# Patient Record
Sex: Male | Born: 1965
Health system: Southern US, Community
[De-identification: ages and names within clinical notes are randomized; demographics above are authoritative.]

## PROBLEM LIST (undated history)

## (undated) DIAGNOSIS — N419 Inflammatory disease of prostate, unspecified: Principal | ICD-10-CM

## (undated) DIAGNOSIS — Z973 Presence of spectacles and contact lenses: Secondary | ICD-10-CM

## (undated) DIAGNOSIS — N320 Bladder-neck obstruction: Secondary | ICD-10-CM

## (undated) DIAGNOSIS — N401 Enlarged prostate with lower urinary tract symptoms: Secondary | ICD-10-CM

## (undated) DIAGNOSIS — H409 Unspecified glaucoma: Secondary | ICD-10-CM

## (undated) DIAGNOSIS — J309 Allergic rhinitis, unspecified: Secondary | ICD-10-CM

## (undated) DIAGNOSIS — I8393 Asymptomatic varicose veins of bilateral lower extremities: Secondary | ICD-10-CM

## (undated) DIAGNOSIS — H269 Unspecified cataract: Secondary | ICD-10-CM

## (undated) HISTORY — PX: EYE SURGERY: SHX253

## (undated) HISTORY — DX: Unspecified glaucoma: H40.9

## (undated) HISTORY — PX: CATARACT EXTRACTION W/ INTRAOCULAR LENS IMPLANT: SHX1309

## (undated) HISTORY — DX: Unspecified cataract: H26.9

## (undated) HISTORY — PX: OTHER SURGICAL HISTORY: SHX169

## (undated) HISTORY — DX: Inflammatory disease of prostate, unspecified: N41.9

---

## 1987-10-06 HISTORY — PX: APPENDECTOMY: SHX54

## 1988-10-05 HISTORY — PX: APPENDECTOMY: SHX54

## 2011-09-05 DIAGNOSIS — Z87438 Personal history of other diseases of male genital organs: Secondary | ICD-10-CM

## 2011-09-05 DIAGNOSIS — N419 Inflammatory disease of prostate, unspecified: Secondary | ICD-10-CM

## 2011-09-05 HISTORY — DX: Personal history of other diseases of male genital organs: Z87.438

## 2011-09-05 HISTORY — DX: Inflammatory disease of prostate, unspecified: N41.9

## 2011-09-11 ENCOUNTER — Ambulatory Visit: Payer: 59

## 2011-09-11 DIAGNOSIS — R300 Dysuria: Secondary | ICD-10-CM

## 2011-11-27 ENCOUNTER — Encounter: Payer: Self-pay | Admitting: Internal Medicine

## 2011-11-27 ENCOUNTER — Ambulatory Visit (INDEPENDENT_AMBULATORY_CARE_PROVIDER_SITE_OTHER): Payer: 59 | Admitting: Internal Medicine

## 2011-11-27 ENCOUNTER — Other Ambulatory Visit (INDEPENDENT_AMBULATORY_CARE_PROVIDER_SITE_OTHER): Payer: 59

## 2011-11-27 VITALS — BP 110/68 | HR 64 | Temp 98.1°F | Resp 16 | Wt 254.5 lb

## 2011-11-27 DIAGNOSIS — N419 Inflammatory disease of prostate, unspecified: Secondary | ICD-10-CM

## 2011-11-27 DIAGNOSIS — Z0000 Encounter for general adult medical examination without abnormal findings: Secondary | ICD-10-CM

## 2011-11-27 DIAGNOSIS — Z136 Encounter for screening for cardiovascular disorders: Secondary | ICD-10-CM

## 2011-11-27 DIAGNOSIS — N410 Acute prostatitis: Secondary | ICD-10-CM | POA: Insufficient documentation

## 2011-11-27 LAB — LIPID PANEL
Cholesterol: 238 mg/dL — ABNORMAL HIGH (ref 0–200)
VLDL: 17.4 mg/dL (ref 0.0–40.0)

## 2011-11-27 LAB — CBC WITH DIFFERENTIAL/PLATELET
Basophils Absolute: 0 10*3/uL (ref 0.0–0.1)
Eosinophils Relative: 2.4 % (ref 0.0–5.0)
HCT: 46.2 % (ref 39.0–52.0)
Lymphocytes Relative: 36.1 % (ref 12.0–46.0)
Lymphs Abs: 1.8 10*3/uL (ref 0.7–4.0)
Monocytes Relative: 7.7 % (ref 3.0–12.0)
Platelets: 212 10*3/uL (ref 150.0–400.0)
RDW: 13.4 % (ref 11.5–14.6)
WBC: 5 10*3/uL (ref 4.5–10.5)

## 2011-11-27 LAB — LDL CHOLESTEROL, DIRECT: Direct LDL: 177.1 mg/dL

## 2011-11-27 LAB — COMPREHENSIVE METABOLIC PANEL
ALT: 45 U/L (ref 0–53)
Albumin: 4.4 g/dL (ref 3.5–5.2)
CO2: 28 mEq/L (ref 19–32)
Calcium: 9.5 mg/dL (ref 8.4–10.5)
Chloride: 102 mEq/L (ref 96–112)
GFR: 79.67 mL/min (ref >60.00)
Glucose, Bld: 87 mg/dL (ref 70–99)
Potassium: 4.6 mEq/L (ref 3.5–5.1)
Sodium: 138 mEq/L (ref 135–145)
Total Protein: 6.9 g/dL (ref 6.0–8.3)

## 2011-11-27 LAB — URINALYSIS, ROUTINE W REFLEX MICROSCOPIC
Bilirubin Urine: NEGATIVE
Ketones, ur: NEGATIVE
Leukocytes, UA: NEGATIVE
Nitrite: NEGATIVE
Specific Gravity, Urine: 1.02 (ref 1.000–1.030)
Total Protein, Urine: NEGATIVE
pH: 6.5 (ref 5.0–8.0)

## 2011-11-27 LAB — PSA: PSA: 2.83 ng/mL (ref 0.10–4.00)

## 2011-11-27 NOTE — Patient Instructions (Signed)
Health Maintenance, Males A healthy lifestyle and preventative care can promote health and wellness.  Maintain regular health, dental, and eye exams.   Eat a healthy diet. Foods like vegetables, fruits, whole grains, low-fat dairy products, and lean protein foods contain the nutrients you need without too many calories. Decrease your intake of foods high in solid fats, added sugars, and salt. Get information about a proper diet from your caregiver, if necessary.   Regular physical exercise is one of the most important things you can do for your health. Most adults should get at least 150 minutes of moderate-intensity exercise (any activity that increases your heart rate and causes you to sweat) each week. In addition, most adults need muscle-strengthening exercises on 2 or more days a week.    Maintain a healthy weight. The body mass index (BMI) is a screening tool to identify possible weight problems. It provides an estimate of body fat based on height and weight. Your caregiver can help determine your BMI, and can help you achieve or maintain a healthy weight. For adults 20 years and older:   A BMI below 18.5 is considered underweight.   A BMI of 18.5 to 24.9 is normal.   A BMI of 25 to 29.9 is considered overweight.   A BMI of 30 and above is considered obese.   Maintain normal blood lipids and cholesterol by exercising and minimizing your intake of saturated fat. Eat a balanced diet with plenty of fruits and vegetables. Blood tests for lipids and cholesterol should begin at age 20 and be repeated every 5 years. If your lipid or cholesterol levels are high, you are over 50, or you are a high risk for heart disease, you may need your cholesterol levels checked more frequently.Ongoing high lipid and cholesterol levels should be treated with medicines, if diet and exercise are not effective.   If you smoke, find out from your caregiver how to quit. If you do not use tobacco, do not start.    If you choose to drink alcohol, do not exceed 2 drinks per day. One drink is considered to be 12 ounces (355 mL) of beer, 5 ounces (148 mL) of wine, or 1.5 ounces (44 mL) of liquor.   Avoid use of street drugs. Do not share needles with anyone. Ask for help if you need support or instructions about stopping the use of drugs.   High blood pressure causes heart disease and increases the risk of stroke. Blood pressure should be checked at least every 1 to 2 years. Ongoing high blood pressure should be treated with medicines if weight loss and exercise are not effective.   If you are 45 to 45 years old, ask your caregiver if you should take aspirin to prevent heart disease.   Diabetes screening involves taking a blood sample to check your fasting blood sugar level. This should be done once every 3 years, after age 45, if you are within normal weight and without risk factors for diabetes. Testing should be considered at a younger age or be carried out more frequently if you are overweight and have at least 1 risk factor for diabetes.   Colorectal cancer can be detected and often prevented. Most routine colorectal cancer screening begins at the age of 50 and continues through age 75. However, your caregiver may recommend screening at an earlier age if you have risk factors for colon cancer. On a yearly basis, your caregiver may provide home test kits to check for hidden   blood in the stool. Use of a small camera at the end of a tube, to directly examine the colon (sigmoidoscopy or colonoscopy), can detect the earliest forms of colorectal cancer. Talk to your caregiver about this at age 50, when routine screening begins. Direct examination of the colon should be repeated every 5 to 10 years through age 75, unless early forms of pre-cancerous polyps or small growths are found.   Healthy men should no longer receive prostate-specific antigen (PSA) blood tests as part of routine cancer screening. Consult with  your caregiver about prostate cancer screening.   Practice safe sex. Use condoms and avoid high-risk sexual practices to reduce the spread of sexually transmitted infections (STIs).   Use sunscreen with a sun protection factor (SPF) of 30 or greater. Apply sunscreen liberally and repeatedly throughout the day. You should seek shade when your shadow is shorter than you. Protect yourself by wearing long sleeves, pants, a wide-brimmed hat, and sunglasses year round, whenever you are outdoors.   Notify your caregiver of new moles or changes in moles, especially if there is a change in shape or color. Also notify your caregiver if a mole is larger than the size of a pencil eraser.   A one-time screening for abdominal aortic aneurysm (AAA) and surgical repair of large AAAs by sound wave imaging (ultrasonography) is recommended for ages 65 to 75 years who are current or former smokers.   Stay current with your immunizations.  Document Released: 03/19/2008 Document Revised: 06/03/2011 Document Reviewed: 02/16/2011 [employer] Patient Information 2012 [employer]. 

## 2011-11-29 ENCOUNTER — Encounter: Payer: Self-pay | Admitting: Internal Medicine

## 2011-11-29 DIAGNOSIS — Z136 Encounter for screening for cardiovascular disorders: Secondary | ICD-10-CM | POA: Insufficient documentation

## 2011-11-29 NOTE — Assessment & Plan Note (Signed)
Exam done, labs ordered, pt ed material was given 

## 2011-11-29 NOTE — Assessment & Plan Note (Signed)
The exam is normal, I will check his PSA and UA

## 2011-11-29 NOTE — Assessment & Plan Note (Signed)
The EKG is normal 

## 2011-11-29 NOTE — Progress Notes (Signed)
  Subjective:    Patient ID: Dakota Walker, male    DOB: [birth year], 46 y.o.   MRN: [id number]  HPI New to me he needs a f/up regarding a prostate gland infection he had about 2 months ago.   Review of Systems  Constitutional: Negative.   HENT: Negative.   Eyes: Negative.   Respiratory: Negative for apnea, cough, choking, chest tightness, shortness of breath, wheezing and stridor.   Cardiovascular: Negative for chest pain, palpitations and leg swelling.  Gastrointestinal: Negative for nausea, vomiting, diarrhea, constipation, blood in stool, abdominal distention and anal bleeding.  Genitourinary: Negative for dysuria, urgency, frequency, hematuria, flank pain, decreased urine volume, discharge, penile swelling, scrotal swelling, enuresis, difficulty urinating, genital sores, penile pain and testicular pain.  Musculoskeletal: Negative for myalgias, back pain, joint swelling, arthralgias and gait problem.  Skin: Negative for color change, pallor, rash and wound.  Hematological: Negative for adenopathy. Does not bruise/bleed easily.  Psychiatric/Behavioral: Negative.        Objective:   Physical Exam  Vitals reviewed. Constitutional: He is oriented to person, place, and time. He appears well-developed and well-nourished. No distress.  HENT:  Head: Normocephalic and atraumatic.  Mouth/Throat: Oropharynx is clear and moist. No oropharyngeal exudate.  Eyes: Conjunctivae are normal. Right eye exhibits no discharge. Left eye exhibits no discharge. No scleral icterus.  Neck: Normal range of motion. Neck supple. No JVD present. No tracheal deviation present. No thyromegaly present.  Cardiovascular: Normal rate, regular rhythm, normal heart sounds and intact distal pulses.  Exam reveals no gallop and no friction rub.   No murmur heard. Pulmonary/Chest: Effort normal and breath sounds normal. No stridor. No respiratory distress. He has no wheezes. He has no rales. He exhibits no  tenderness.  Abdominal: Soft. Bowel sounds are normal. He exhibits no distension and no mass. There is no tenderness. There is no rebound and no guarding. Hernia confirmed negative in the right inguinal area and confirmed negative in the left inguinal area.  Genitourinary: Rectum normal, prostate normal, testes normal and penis normal. Rectal exam shows no external hemorrhoid, no internal hemorrhoid, no fissure, no mass, no tenderness and anal tone normal. Guaiac negative stool. Prostate is not enlarged and not tender. Right testis shows no mass, no swelling and no tenderness. Right testis is descended. Left testis shows no mass, no swelling and no tenderness. Left testis is descended. Circumcised. No penile tenderness. No discharge found.  Musculoskeletal: Normal range of motion. He exhibits no edema and no tenderness.  Lymphadenopathy:    He has no cervical adenopathy.       Right: No inguinal adenopathy present.       Left: No inguinal adenopathy present.  Neurological: He is oriented to person, place, and time.  Skin: Skin is warm and dry. No rash noted. He is not diaphoretic. No erythema. No pallor.  Psychiatric: He has a normal mood and affect. His behavior is normal. Judgment and thought content normal.          Assessment & Plan:

## 2011-12-03 ENCOUNTER — Ambulatory Visit (INDEPENDENT_AMBULATORY_CARE_PROVIDER_SITE_OTHER): Payer: 59 | Admitting: Internal Medicine

## 2011-12-03 ENCOUNTER — Encounter: Payer: Self-pay | Admitting: Internal Medicine

## 2011-12-03 VITALS — BP 130/72 | HR 71 | Temp 97.1°F | Resp 18 | Wt 256.5 lb

## 2011-12-03 DIAGNOSIS — J309 Allergic rhinitis, unspecified: Secondary | ICD-10-CM

## 2011-12-03 DIAGNOSIS — J019 Acute sinusitis, unspecified: Secondary | ICD-10-CM

## 2011-12-03 DIAGNOSIS — E7800 Pure hypercholesterolemia, unspecified: Secondary | ICD-10-CM | POA: Insufficient documentation

## 2011-12-03 MED ORDER — METHYLPREDNISOLONE ACETATE 80 MG/ML IJ SUSP
120.0000 mg | Freq: Once | INTRAMUSCULAR | Status: AC
  Administered 2011-12-03: 120 mg via INTRAMUSCULAR

## 2011-12-03 MED ORDER — MOXIFLOXACIN HCL 400 MG PO TABS: 400.0000 mg | ORAL_TABLET | Freq: Every day | ORAL | Status: AC

## 2011-12-03 MED ORDER — BECLOMETHASONE DIPROPIONATE 80 MCG/ACT NA AERS: 2.0000 | INHALATION_SPRAY | Freq: Every day | NASAL | Status: DC

## 2011-12-03 NOTE — Progress Notes (Signed)
Subjective:    Patient ID: Dakota Walker, male    DOB: May 09, 1966, 46 y.o.   MRN: [id number]  Sinusitis This is a new problem. The current episode started in the past 7 days. The problem has been gradually worsening since onset. There has been no fever. His pain is at a severity of 0/10. He is experiencing no pain. Associated symptoms include sinus pressure and a sore throat. Pertinent negatives include no chills, congestion, coughing, diaphoresis, ear pain, headaches, hoarse voice, neck pain, shortness of breath, sneezing or swollen glands. Past treatments include oral decongestants. The treatment provided mild relief.      Review of Systems  Constitutional: Negative for fever, chills, diaphoresis, activity change, appetite change, fatigue and unexpected weight change.  HENT: Positive for sore throat, rhinorrhea, postnasal drip and sinus pressure. Negative for hearing loss, ear pain, nosebleeds, congestion, hoarse voice, facial swelling, sneezing, drooling, mouth sores, trouble swallowing, neck pain, neck stiffness, dental problem, voice change, tinnitus and ear discharge.   Eyes: Negative.   Respiratory: Negative.  Negative for cough and shortness of breath.   Cardiovascular: Negative for chest pain, palpitations and leg swelling.  Gastrointestinal: Negative.   Genitourinary: Negative.   Musculoskeletal: Negative for myalgias, back pain, joint swelling, arthralgias and gait problem.  Skin: Negative.   Neurological: Negative.  Negative for headaches.  Hematological: Negative for adenopathy. Does not bruise/bleed easily.       Objective:   Physical Exam  Vitals reviewed. Constitutional: He is oriented to person, place, and time. He appears well-developed and well-nourished. No distress.  HENT:  Head: Normocephalic and atraumatic.  Right Ear: Hearing, tympanic membrane, external ear and ear canal normal.  Left Ear: Hearing, tympanic membrane, external ear and ear canal normal.    Nose: Mucosal edema and rhinorrhea present. No nose lacerations, sinus tenderness, nasal deformity, septal deviation or nasal septal hematoma. No epistaxis.  No foreign bodies. Right sinus exhibits maxillary sinus tenderness. Right sinus exhibits no frontal sinus tenderness. Left sinus exhibits maxillary sinus tenderness. Left sinus exhibits no frontal sinus tenderness.  Mouth/Throat: Mucous membranes are normal. Mucous membranes are not pale, not dry and not cyanotic. Posterior oropharyngeal erythema present. No oropharyngeal exudate, posterior oropharyngeal edema or tonsillar abscesses.  Eyes: Conjunctivae are normal. Right eye exhibits no discharge. Left eye exhibits no discharge. No scleral icterus.  Neck: Normal range of motion. Neck supple. No JVD present. No tracheal deviation present. No thyromegaly present.  Cardiovascular: Normal rate, regular rhythm, normal heart sounds and intact distal pulses.  Exam reveals no gallop and no friction rub.   No murmur heard. Pulmonary/Chest: Effort normal and breath sounds normal. No stridor. No respiratory distress. He has no wheezes. He has no rales. He exhibits no tenderness.  Abdominal: Soft. Bowel sounds are normal. He exhibits no distension and no mass. There is no tenderness. There is no rebound and no guarding.  Musculoskeletal: Normal range of motion. He exhibits no edema and no tenderness.  Lymphadenopathy:    He has no cervical adenopathy.  Neurological: He is oriented to person, place, and time.  Skin: Skin is warm and dry. No rash noted. He is not diaphoretic. No erythema. No pallor.  Psychiatric: He has a normal mood and affect. His behavior is normal. Judgment and thought content normal.      Lab Results  Component Value Date   WBC 5.0 11/27/2011   HGB 15.4 11/27/2011   HCT 46.2 11/27/2011   PLT 212.0 11/27/2011   GLUCOSE 87 11/27/2011  CHOL 238* 11/27/2011   TRIG 87.0 11/27/2011   HDL 44.10 11/27/2011   LDLDIRECT 177.1 11/27/2011    ALT 45 11/27/2011   AST 26 11/27/2011   NA 138 11/27/2011   K 4.6 11/27/2011   CL 102 11/27/2011   CREATININE 1.1 11/27/2011   BUN 14 11/27/2011   CO2 28 11/27/2011   PSA 2.83 11/27/2011      Assessment & Plan:

## 2011-12-03 NOTE — Assessment & Plan Note (Signed)
Start avelox for the infection 

## 2011-12-03 NOTE — Assessment & Plan Note (Signed)
He is not willing to take a statin 

## 2011-12-03 NOTE — Assessment & Plan Note (Signed)
Start Qnasl for the inflammation and he was given an injection of depo-medrol IM for additional symptoms relief

## 2011-12-03 NOTE — Patient Instructions (Signed)
Allergic Rhinitis Allergic rhinitis is when the mucous membranes in the nose respond to allergens. Allergens are particles in the air that cause your body to have an allergic reaction. This causes you to release allergic antibodies. Through a chain of events, these eventually cause you to release histamine into the blood stream (hence the use of antihistamines). Although meant to be protective to the body, it is this release that causes your discomfort, such as frequent sneezing, congestion and an itchy runny nose.  CAUSES  The pollen allergens may come from grasses, trees, and weeds. This is seasonal allergic rhinitis, or "hay fever." Other allergens cause year-round allergic rhinitis (perennial allergic rhinitis) such as house dust mite allergen, pet dander and mold spores.  SYMPTOMS   Nasal stuffiness (congestion).   Runny, itchy nose with sneezing and tearing of the eyes.   There is often an itching of the mouth, eyes and ears.  It cannot be cured, but it can be controlled with medications. DIAGNOSIS  If you are unable to determine the offending allergen, skin or blood testing may find it. TREATMENT   Avoid the allergen.   Medications and allergy shots (immunotherapy) can help.   Hay fever may often be treated with antihistamines in pill or nasal spray forms. Antihistamines block the effects of histamine. There are over-the-counter medicines that may help with nasal congestion and swelling around the eyes. Check with your caregiver before taking or giving this medicine.  If the treatment above does not work, there are many new medications your caregiver can prescribe. Stronger medications may be used if initial measures are ineffective. Desensitizing injections can be used if medications and avoidance fails. Desensitization is when a patient is given ongoing shots until the body becomes less sensitive to the allergen. Make sure you follow up with your caregiver if problems continue. SEEK  MEDICAL CARE IF:   You develop fever (more than 100.5 F (38.1 C).   You develop a cough that does not stop easily (persistent).   You have shortness of breath.   You start wheezing.   Symptoms interfere with normal daily activities.  Document Released: 06/16/2001 Document Revised: 06/03/2011 Document Reviewed: 12/26/2008 [employer] Patient Information 2012 [employer].  Sinusitis Sinuses are air pockets within the bones of your face. The growth of bacteria within a sinus leads to infection. The infection prevents the sinuses from draining. This infection is called sinusitis. SYMPTOMS  There will be different areas of pain depending on which sinuses have become infected.  The maxillary sinuses often produce pain beneath the eyes.   Frontal sinusitis may cause pain in the middle of the forehead and above the eyes.  Other problems (symptoms) include:  Toothaches.   Colored, pus-like (purulent) drainage from the nose.   Swelling, warmth, and tenderness over the sinus areas may be signs of infection.  TREATMENT  Sinusitis is most often determined by an exam.X-rays may be taken. If x-rays have been taken, make sure you obtain your results or find out how you are to obtain them. Your caregiver may give you medications (antibiotics). These are medications that will help kill the bacteria causing the infection. You may also be given a medication (decongestant) that helps to reduce sinus swelling.  HOME CARE INSTRUCTIONS   Only take over-the-counter or prescription medicines for pain, discomfort, or fever as directed by your caregiver.   Drink extra fluids. Fluids help thin the mucus so your sinuses can drain more easily.   Applying either moist heat or   ice packs to the sinus areas may help relieve discomfort.   Use saline nasal sprays to help moisten your sinuses. The sprays can be found at your local drugstore.  SEEK IMMEDIATE MEDICAL CARE IF:  You have a fever.   You have  increasing pain, severe headaches, or toothache.   You have nausea, vomiting, or drowsiness.   You develop unusual swelling around the face or trouble seeing.  MAKE SURE YOU:   Understand these instructions.   Will watch your condition.   Will get help right away if you are not doing well or get worse.  Document Released: 09/21/2005 Document Revised: 06/03/2011 Document Reviewed: 04/20/2007 [employer] Patient Information 2012 [employer]. 

## 2011-12-04 ENCOUNTER — Telehealth: Payer: Self-pay

## 2011-12-04 NOTE — Telephone Encounter (Signed)
yes

## 2011-12-04 NOTE — Telephone Encounter (Signed)
Pt called requesting MD's advisement on if he can take Sudafed with nasal spray Rx'd at yesterday's appt, please advise.

## 2011-12-04 NOTE — Telephone Encounter (Signed)
Pt informed

## 2011-12-24 ENCOUNTER — Ambulatory Visit: Payer: 59 | Admitting: Internal Medicine

## 2011-12-24 DIAGNOSIS — Z0289 Encounter for other administrative examinations: Secondary | ICD-10-CM

## 2013-02-09 ENCOUNTER — Ambulatory Visit (INDEPENDENT_AMBULATORY_CARE_PROVIDER_SITE_OTHER): Payer: 59 | Admitting: Family Medicine

## 2013-02-09 ENCOUNTER — Encounter: Payer: Self-pay | Admitting: Family Medicine

## 2013-02-09 VITALS — BP 124/72 | HR 72 | Temp 98.3°F | Wt 257.5 lb

## 2013-02-09 DIAGNOSIS — J029 Acute pharyngitis, unspecified: Secondary | ICD-10-CM | POA: Insufficient documentation

## 2013-02-09 NOTE — Assessment & Plan Note (Signed)
Symptomatic care. Likely will progress to congestion , cough etc.

## 2013-02-09 NOTE — Progress Notes (Signed)
  Subjective:    Patient ID: Dakota Walker, male    DOB: 1966/01/31, 47 y.o.   MRN: [id number]  HPI   47 year old male presents with sore throat.  Had seen eye MD on MOnday for eye infection.. Started on Z-pak and eye drops.   In last 24 hours had severe sore throat.  Has post nasal drip.  No nasal congestion. No fever. NO SOB, no face pain, no ear pain.  Eye has improved.  Using  Cepacol, nyquil.  Son sick contact... Resolved now. No strep or mono exposure.  Review of Systems  Constitutional: Negative for fever and fatigue.  HENT: Negative for congestion.   Eyes: Negative for redness.  Respiratory: Negative for shortness of breath.   Cardiovascular: Negative for chest pain.       Objective:   Physical Exam  Constitutional: Vital signs are normal. He appears well-developed and well-nourished.  Non-toxic appearance. He does not appear ill. No distress.  HENT:  Head: Normocephalic and atraumatic.  Right Ear: Hearing, tympanic membrane, external ear and ear canal normal. No tenderness. No foreign bodies. Tympanic membrane is not retracted and not bulging.  Left Ear: Hearing, tympanic membrane, external ear and ear canal normal. No tenderness. No foreign bodies. Tympanic membrane is not retracted and not bulging.  Nose: Nose normal. No mucosal edema or rhinorrhea. Right sinus exhibits no maxillary sinus tenderness and no frontal sinus tenderness. Left sinus exhibits no maxillary sinus tenderness and no frontal sinus tenderness.  Mouth/Throat: Uvula is midline and mucous membranes are normal. Normal dentition. No dental caries. Posterior oropharyngeal erythema present. No oropharyngeal exudate, posterior oropharyngeal edema or tonsillar abscesses.  Eyes: Conjunctivae, EOM and lids are normal. Pupils are equal, round, and reactive to light. No foreign bodies found.  Neck: Trachea normal, normal range of motion and phonation normal. Neck supple. Carotid bruit is not present. No  mass and no thyromegaly present.  Cardiovascular: Normal rate, regular rhythm, S1 normal, S2 normal, normal heart sounds, intact distal pulses and normal pulses.  Exam reveals no gallop.   No murmur heard. Pulmonary/Chest: Effort normal and breath sounds normal. No respiratory distress. He has no wheezes. He has no rhonchi. He has no rales.  Abdominal: Soft. Normal appearance and bowel sounds are normal. There is no hepatosplenomegaly. There is no tenderness. There is no rebound, no guarding and no CVA tenderness. No hernia.  Neurological: He is alert. He has normal reflexes.  Skin: Skin is warm, dry and intact. No rash noted.  Psychiatric: He has a normal mood and affect. His speech is normal and behavior is normal. Judgment normal.          Assessment & Plan:

## 2013-02-09 NOTE — Patient Instructions (Addendum)
Ibuprofen 600 mg every 6 hours for sore throat.  Most likely viral infection, but you are on Z-pak for eye that will cover strep throat as well. Call if fever on antibiotics or unilateral throat pain.

## 2014-11-08 ENCOUNTER — Ambulatory Visit (INDEPENDENT_AMBULATORY_CARE_PROVIDER_SITE_OTHER): Payer: BLUE CROSS/BLUE SHIELD | Admitting: Internal Medicine

## 2014-11-08 ENCOUNTER — Encounter: Payer: Self-pay | Admitting: Internal Medicine

## 2014-11-08 ENCOUNTER — Ambulatory Visit (INDEPENDENT_AMBULATORY_CARE_PROVIDER_SITE_OTHER)
Admission: RE | Admit: 2014-11-08 | Discharge: 2014-11-08 | Disposition: A | Discharge status: Home / Self Care | Payer: BLUE CROSS/BLUE SHIELD | Source: Ambulatory Visit | Attending: Internal Medicine | Admitting: Internal Medicine

## 2014-11-08 ENCOUNTER — Other Ambulatory Visit (INDEPENDENT_AMBULATORY_CARE_PROVIDER_SITE_OTHER): Payer: BLUE CROSS/BLUE SHIELD

## 2014-11-08 VITALS — BP 110/64 | HR 54 | Temp 98.7°F | Resp 16 | Ht 72.0 in | Wt 227.0 lb

## 2014-11-08 DIAGNOSIS — M25551 Pain in right hip: Secondary | ICD-10-CM

## 2014-11-08 DIAGNOSIS — Z0000 Encounter for general adult medical examination without abnormal findings: Secondary | ICD-10-CM

## 2014-11-08 DIAGNOSIS — M545 Low back pain, unspecified: Secondary | ICD-10-CM

## 2014-11-08 LAB — CBC WITH DIFFERENTIAL/PLATELET
Basophils Absolute: 0 10*3/uL (ref 0.0–0.1)
Basophils Relative: 0.4 % (ref 0.0–3.0)
EOS ABS: 0 10*3/uL (ref 0.0–0.7)
Eosinophils Relative: 0.4 % (ref 0.0–5.0)
HCT: 43.2 % (ref 39.0–52.0)
Hemoglobin: 14.8 g/dL (ref 13.0–17.0)
Lymphocytes Relative: 27.3 % (ref 12.0–46.0)
Lymphs Abs: 1.5 10*3/uL (ref 0.7–4.0)
MCHC: 34.3 g/dL (ref 30.0–36.0)
MCV: 93.3 fl (ref 78.0–100.0)
Monocytes Absolute: 0.4 10*3/uL (ref 0.1–1.0)
Monocytes Relative: 7.9 % (ref 3.0–12.0)
NEUTROS PCT: 64 % (ref 43.0–77.0)
Neutro Abs: 3.5 10*3/uL (ref 1.4–7.7)
PLATELETS: 210 10*3/uL (ref 150.0–400.0)
RBC: 4.63 Mil/uL (ref 4.22–5.81)
RDW: 13.6 % (ref 11.5–15.5)
WBC: 5.4 10*3/uL (ref 4.0–10.5)

## 2014-11-08 LAB — COMPREHENSIVE METABOLIC PANEL
ALBUMIN: 4.5 g/dL (ref 3.5–5.2)
ALT: 16 U/L (ref 0–53)
AST: 19 U/L (ref 0–37)
Alkaline Phosphatase: 51 U/L (ref 39–117)
BILIRUBIN TOTAL: 0.9 mg/dL (ref 0.2–1.2)
BUN: 25 mg/dL — ABNORMAL HIGH (ref 6–23)
CHLORIDE: 104 meq/L (ref 96–112)
CO2: 27 mEq/L (ref 19–32)
CREATININE: 1.24 mg/dL (ref 0.40–1.50)
Calcium: 9.5 mg/dL (ref 8.4–10.5)
GFR: 66.33 mL/min (ref >60.00)
Glucose, Bld: 76 mg/dL (ref 70–99)
Potassium: 4.7 mEq/L (ref 3.5–5.1)
Sodium: 137 mEq/L (ref 135–145)
Total Protein: 6.7 g/dL (ref 6.0–8.3)

## 2014-11-08 LAB — LIPID PANEL
Cholesterol: 169 mg/dL (ref 0–200)
HDL: 44.6 mg/dL (ref >39.00)
LDL Cholesterol: 115 mg/dL — ABNORMAL HIGH (ref 0–99)
NonHDL: 124.4
TRIGLYCERIDES: 47 mg/dL (ref 0.0–149.0)
Total CHOL/HDL Ratio: 4
VLDL: 9.4 mg/dL (ref 0.0–40.0)

## 2014-11-08 LAB — URINALYSIS, ROUTINE W REFLEX MICROSCOPIC
BILIRUBIN URINE: NEGATIVE
Hgb urine dipstick: NEGATIVE
KETONES UR: 15 — AB
Leukocytes, UA: NEGATIVE
Nitrite: NEGATIVE
PH: 6 (ref 5.0–8.0)
RBC / HPF: NONE SEEN (ref 0–?)
Specific Gravity, Urine: <=1.005 — AB (ref 1.000–1.030)
TOTAL PROTEIN, URINE-UPE24: NEGATIVE
URINE GLUCOSE: NEGATIVE
Urobilinogen, UA: 0.2 (ref 0.0–1.0)

## 2014-11-08 LAB — SEDIMENTATION RATE: SED RATE: 7 mm/h (ref 0–22)

## 2014-11-08 LAB — TSH: TSH: 1.22 u[IU]/mL (ref 0.35–4.50)

## 2014-11-08 NOTE — Assessment & Plan Note (Signed)
Exam is normal Will check a plain film to see if there is a boney lesion, DJD, etc Will check labs to see if there is concern for systemic illness

## 2014-11-08 NOTE — Assessment & Plan Note (Signed)
Vaccines were reviewed and updated Exam done Labs ordered Pt ed material was given 

## 2014-11-08 NOTE — Patient Instructions (Signed)
Hip Pain Your hip is the joint between your upper legs and your lower pelvis. The bones, cartilage, tendons, and muscles of your hip joint perform a lot of work each day supporting your body weight and allowing you to move around. Hip pain can range from a minor ache to severe pain in one or both of your hips. Pain may be felt on the inside of the hip joint near the groin, or the outside near the buttocks and upper thigh. You may have swelling or stiffness as well.  HOME CARE INSTRUCTIONS   Take medicines only as directed by your health care provider.  Apply ice to the injured area:  Put ice in a plastic bag.  Place a towel between your skin and the bag.  Leave the ice on for 15-20 minutes at a time, 3-4 times a day.  Keep your leg raised (elevated) when possible to lessen swelling.  Avoid activities that cause pain.  Follow specific exercises as directed by your health care provider.  Sleep with a pillow between your legs on your most comfortable side.  Record how often you have hip pain, the location of the pain, and what it feels like. SEEK MEDICAL CARE IF:   You are unable to put weight on your leg.  Your hip is red or swollen or very tender to touch.  Your pain or swelling continues or worsens after 1 week.  You have increasing difficulty walking.  You have a fever. SEEK IMMEDIATE MEDICAL CARE IF:   You have fallen.  You have a sudden increase in pain and swelling in your hip. MAKE SURE YOU:   Understand these instructions.  Will watch your condition.  Will get help right away if you are not doing well or get worse. Document Released: 03/11/2010 Document Revised: 02/05/2014 Document Reviewed: 05/18/2013 [employer] Patient Information 2015 [employer]. This information is not intended to replace advice given to you by your health care provider. Make sure you discuss any questions you have with your health care provider.  

## 2014-11-08 NOTE — Progress Notes (Signed)
Subjective:    Patient ID: Dakota Walker, male    DOB: 18-Dec-1965, 49 y.o.   MRN: [id number]  HPI  He comes in for a physical but he also complains that his right hip hurts when he rolls over in the bed at night. This has been occurring for 2 months with no preceding trauma or injury. There is no pain during the day or with activity, he has not taken anything for pain and he dose not want anything for pain.  Review of Systems  Constitutional: Negative for fever, chills, diaphoresis, activity change, appetite change, fatigue and unexpected weight change.  HENT: Negative.   Eyes: Negative.   Respiratory: Negative.  Negative for cough, choking, chest tightness, shortness of breath and stridor.   Cardiovascular: Negative.  Negative for chest pain, palpitations and leg swelling.  Gastrointestinal: Negative.  Negative for nausea, vomiting, abdominal pain, diarrhea, constipation and blood in stool.  Endocrine: Negative.   Genitourinary: Negative.   Musculoskeletal: Positive for arthralgias (rt hip only). Negative for myalgias, back pain, joint swelling, gait problem, neck pain and neck stiffness.  Skin: Negative.  Negative for rash.  Allergic/Immunologic: Negative.   Neurological: Negative.   Hematological: Negative.  Negative for adenopathy. Does not bruise/bleed easily.  Psychiatric/Behavioral: Negative.        Objective:   Physical Exam  Constitutional: He is oriented to person, place, and time. He appears well-developed and well-nourished. No distress.  HENT:  Head: Normocephalic and atraumatic.  Mouth/Throat: Oropharynx is clear and moist. No oropharyngeal exudate.  Eyes: Conjunctivae and EOM are normal. Pupils are equal, round, and reactive to light. Right eye exhibits no discharge. Left eye exhibits no discharge. No scleral icterus.  Neck: Normal range of motion. Neck supple. No JVD present. No tracheal deviation present. No thyromegaly present.  Cardiovascular: Normal  rate, regular rhythm, normal heart sounds and intact distal pulses.  Exam reveals no gallop and no friction rub.   No murmur heard. Pulmonary/Chest: Effort normal and breath sounds normal. No stridor. No respiratory distress. He has no wheezes. He has no rales. He exhibits no tenderness.  Abdominal: Soft. Bowel sounds are normal. He exhibits no distension and no mass. There is no tenderness. There is no rebound and no guarding. Hernia confirmed negative in the right inguinal area and confirmed negative in the left inguinal area.  Genitourinary: Testes normal and penis normal. Right testis shows no mass, no swelling and no tenderness. Right testis is descended. Left testis shows no mass, no swelling and no tenderness. Left testis is descended. Circumcised. No penile erythema or penile tenderness. No discharge found.  Musculoskeletal: Normal range of motion. He exhibits no edema or tenderness.       Right hip: Normal. He exhibits normal range of motion, normal strength, no tenderness, no bony tenderness, no swelling, no crepitus, no deformity and no laceration.  Lymphadenopathy:    He has no cervical adenopathy.       Right: No inguinal adenopathy present.       Left: No inguinal adenopathy present.  Neurological: He is oriented to person, place, and time.  Skin: Skin is warm and dry. No rash noted. He is not diaphoretic. No erythema. No pallor.  Psychiatric: He has a normal mood and affect. His behavior is normal. Judgment and thought content normal.  Nursing note and vitals reviewed.   Lab Results  Component Value Date   WBC 5.0 11/27/2011   HGB 15.4 11/27/2011   HCT 46.2 11/27/2011   PLT  212.0 11/27/2011   GLUCOSE 87 11/27/2011   CHOL 238* 11/27/2011   TRIG 87.0 11/27/2011   HDL 44.10 11/27/2011   LDLDIRECT 177.1 11/27/2011   ALT 45 11/27/2011   AST 26 11/27/2011   NA 138 11/27/2011   K 4.6 11/27/2011   CL 102 11/27/2011   CREATININE 1.1 11/27/2011   BUN 14 11/27/2011   CO2 28  11/27/2011   PSA 2.83 11/27/2011        Assessment & Plan:

## 2014-11-09 ENCOUNTER — Encounter: Payer: Self-pay | Admitting: Internal Medicine

## 2014-12-07 ENCOUNTER — Ambulatory Visit (INDEPENDENT_AMBULATORY_CARE_PROVIDER_SITE_OTHER): Payer: BLUE CROSS/BLUE SHIELD | Admitting: Internal Medicine

## 2014-12-07 ENCOUNTER — Encounter: Payer: Self-pay | Admitting: Internal Medicine

## 2014-12-07 VITALS — BP 132/82 | HR 54 | Temp 97.7°F | Resp 16 | Ht 72.0 in | Wt 228.0 lb

## 2014-12-07 DIAGNOSIS — Z23 Encounter for immunization: Secondary | ICD-10-CM

## 2014-12-07 DIAGNOSIS — M25551 Pain in right hip: Secondary | ICD-10-CM

## 2014-12-07 NOTE — Progress Notes (Signed)
Pre visit review using our clinic review tool, if applicable. No additional management support is needed unless otherwise documented below in the visit note. 

## 2014-12-09 ENCOUNTER — Encounter: Payer: Self-pay | Admitting: Internal Medicine

## 2014-12-09 NOTE — Assessment & Plan Note (Signed)
Plain films were normal Exam is normal today Pain has resolved This appears to have been an episode of overuse Will follow for now

## 2014-12-09 NOTE — Progress Notes (Signed)
   Subjective:    Patient ID: Dakota Walker, male    DOB: [birth year], 49 y.o.   MRN: [id number]  HPI Comments: He returns for f/up and tells me that his hip and back pain has resolved, he is training for a marathon.     Review of Systems  Constitutional: Negative for fever, chills, diaphoresis, appetite change and fatigue.  HENT: Negative.   Eyes: Negative.   Respiratory: Negative.   Cardiovascular: Negative.  Negative for leg swelling.  Gastrointestinal: Negative.  Negative for abdominal pain.  Endocrine: Negative.   Musculoskeletal: Negative.  Negative for back pain and arthralgias.  Skin: Negative.   Allergic/Immunologic: Negative.   Neurological: Negative.   Hematological: Negative.  Negative for adenopathy. Does not bruise/bleed easily.  Psychiatric/Behavioral: Negative.        Objective:   Physical Exam  Constitutional: He is oriented to person, place, and time. He appears well-developed and well-nourished. No distress.  HENT:  Head: Normocephalic and atraumatic.  Mouth/Throat: Oropharynx is clear and moist. No oropharyngeal exudate.  Eyes: Conjunctivae are normal. Right eye exhibits no discharge. Left eye exhibits no discharge. No scleral icterus.  Neck: Normal range of motion. Neck supple. No JVD present. No tracheal deviation present. No thyromegaly present.  Cardiovascular: Normal rate, regular rhythm, normal heart sounds and intact distal pulses.  Exam reveals no gallop and no friction rub.   No murmur heard. Pulmonary/Chest: Effort normal and breath sounds normal. No stridor. No respiratory distress. He has no wheezes. He has no rales. He exhibits no tenderness.  Abdominal: Soft. Bowel sounds are normal. He exhibits no distension and no mass. There is no tenderness. There is no rebound and no guarding.  Musculoskeletal: Normal range of motion. He exhibits no edema or tenderness.       Right hip: Normal. He exhibits normal range of motion, normal strength, no  tenderness, no bony tenderness, no swelling, no crepitus, no deformity and no laceration.  Lymphadenopathy:    He has no cervical adenopathy.  Neurological: He is oriented to person, place, and time.  Skin: Skin is warm and dry. No rash noted. He is not diaphoretic. No erythema. No pallor.  Vitals reviewed.   Lab Results  Component Value Date   WBC 5.4 11/08/2014   HGB 14.8 11/08/2014   HCT 43.2 11/08/2014   PLT 210.0 11/08/2014   GLUCOSE 76 11/08/2014   CHOL 169 11/08/2014   TRIG 47.0 11/08/2014   HDL 44.60 11/08/2014   LDLDIRECT 177.1 11/27/2011   LDLCALC 115* 11/08/2014   ALT 16 11/08/2014   AST 19 11/08/2014   NA 137 11/08/2014   K 4.7 11/08/2014   CL 104 11/08/2014   CREATININE 1.24 11/08/2014   BUN 25* 11/08/2014   CO2 27 11/08/2014   TSH 1.22 11/08/2014   PSA 2.83 11/27/2011        Assessment & Plan:

## 2015-01-15 ENCOUNTER — Encounter: Payer: Self-pay | Admitting: Internal Medicine

## 2015-01-15 ENCOUNTER — Ambulatory Visit (INDEPENDENT_AMBULATORY_CARE_PROVIDER_SITE_OTHER): Payer: BLUE CROSS/BLUE SHIELD | Admitting: Internal Medicine

## 2015-01-15 VITALS — BP 120/76 | HR 51 | Temp 97.5°F | Resp 16 | Wt 222.0 lb

## 2015-01-15 DIAGNOSIS — J302 Other seasonal allergic rhinitis: Secondary | ICD-10-CM | POA: Diagnosis not present

## 2015-01-15 DIAGNOSIS — J330 Polyp of nasal cavity: Secondary | ICD-10-CM | POA: Diagnosis not present

## 2015-01-15 MED ORDER — FLUTICASONE PROPIONATE 50 MCG/ACT NA SUSP: 2.0000 | Freq: Every day | NASAL | Status: DC

## 2015-01-15 NOTE — Progress Notes (Signed)
Pre visit review using our clinic review tool, if applicable. No additional management support is needed unless otherwise documented below in the visit note. 

## 2015-01-15 NOTE — Patient Instructions (Signed)

## 2015-01-16 NOTE — Assessment & Plan Note (Signed)
Will start flonase ns to try to shrink this down Will send to ENT to consider excision as well

## 2015-01-16 NOTE — Progress Notes (Signed)
   Subjective:    Patient ID: Dakota Walker, male    DOB: [birth year], 49 y.o.   MRN: [id number]  HPI Comments: He complains of pain and congestion on the right side of his nose for about 1 week     Review of Systems  Constitutional: Negative.  Negative for fever, chills, diaphoresis, appetite change and fatigue.  HENT: Positive for postnasal drip, rhinorrhea and sneezing. Negative for congestion, nosebleeds, sinus pressure, sore throat, tinnitus, trouble swallowing and voice change.   Eyes: Negative.   Respiratory: Negative.  Negative for cough, choking, chest tightness, shortness of breath and stridor.   Cardiovascular: Negative.  Negative for chest pain, palpitations and leg swelling.  Gastrointestinal: Negative.  Negative for nausea, vomiting, abdominal pain, diarrhea, constipation and blood in stool.  Endocrine: Negative.   Genitourinary: Negative.   Musculoskeletal: Negative.  Negative for myalgias, back pain, joint swelling and arthralgias.  Skin: Negative.  Negative for rash.  Allergic/Immunologic: Negative.   Neurological: Negative.   Hematological: Negative.  Negative for adenopathy. Does not bruise/bleed easily.  Psychiatric/Behavioral: Negative.        Objective:   Physical Exam  Constitutional: He is oriented to person, place, and time. He appears well-developed and well-nourished. No distress.  HENT:  Head: Normocephalic and atraumatic.  Right Ear: Hearing, tympanic membrane, external ear and ear canal normal.  Left Ear: Hearing, tympanic membrane, external ear and ear canal normal.  Nose: Mucosal edema and nasal deformity present. No rhinorrhea, nose lacerations, sinus tenderness, septal deviation or nasal septal hematoma. No epistaxis.  No foreign bodies. Right sinus exhibits no maxillary sinus tenderness and no frontal sinus tenderness. Left sinus exhibits no maxillary sinus tenderness and no frontal sinus tenderness.  Mouth/Throat: Oropharynx is clear and  moist and mucous membranes are normal. Mucous membranes are not pale, not dry and not cyanotic. No oral lesions. No trismus in the jaw. No uvula swelling. No oropharyngeal exudate, posterior oropharyngeal edema, posterior oropharyngeal erythema or tonsillar abscesses.  Right nares posterior to the turbinate there is a fleshy protrusion that appears to be a polyp  Eyes: Conjunctivae are normal. Right eye exhibits no discharge. Left eye exhibits no discharge. No scleral icterus.  Neck: Normal range of motion. Neck supple. No JVD present. No tracheal deviation present. No thyromegaly present.  Cardiovascular: Normal rate, regular rhythm, normal heart sounds and intact distal pulses.  Exam reveals no gallop and no friction rub.   No murmur heard. Pulmonary/Chest: Effort normal and breath sounds normal. No stridor. No respiratory distress. He has no wheezes. He has no rales. He exhibits no tenderness.  Abdominal: Soft. Bowel sounds are normal. He exhibits no distension and no mass. There is no tenderness. There is no rebound and no guarding.  Musculoskeletal: Normal range of motion. He exhibits no edema or tenderness.  Lymphadenopathy:    He has no cervical adenopathy.  Neurological: He is oriented to person, place, and time.  Skin: Skin is warm and dry. No rash noted. He is not diaphoretic. No erythema. No pallor.  Vitals reviewed.         Assessment & Plan:

## 2015-01-16 NOTE — Assessment & Plan Note (Signed)
I will start treating the allergies with flonase ns

## 2015-11-22 ENCOUNTER — Ambulatory Visit (INDEPENDENT_AMBULATORY_CARE_PROVIDER_SITE_OTHER): Payer: BLUE CROSS/BLUE SHIELD | Admitting: Physician Assistant

## 2015-11-22 VITALS — BP 110/70 | HR 87 | Temp 98.3°F | Resp 20 | Ht 75.2 in | Wt 223.2 lb

## 2015-11-22 DIAGNOSIS — R05 Cough: Secondary | ICD-10-CM

## 2015-11-22 DIAGNOSIS — R6889 Other general symptoms and signs: Secondary | ICD-10-CM | POA: Diagnosis not present

## 2015-11-22 DIAGNOSIS — R059 Cough, unspecified: Secondary | ICD-10-CM

## 2015-11-22 LAB — POCT INFLUENZA A/B
INFLUENZA B, POC: NEGATIVE
Influenza A, POC: POSITIVE — AB

## 2015-11-22 MED ORDER — HYDROCODONE-HOMATROPINE 5-1.5 MG/5ML PO SYRP: 5.0000 mL | ORAL_SOLUTION | Freq: Three times a day (TID) | ORAL | Status: DC | PRN

## 2015-11-22 MED ORDER — OSELTAMIVIR PHOSPHATE 75 MG PO CAPS: 75.0000 mg | ORAL_CAPSULE | Freq: Two times a day (BID) | ORAL | Status: DC

## 2015-11-22 NOTE — Patient Instructions (Signed)

## 2015-11-22 NOTE — Progress Notes (Signed)
   Subjective:    Patient ID: Dakota Walker, male    DOB: 03/13/1966, 50 y.o.   MRN: [id number]  Chief Complaint  Patient presents with  . Influenza    yesterday    Medications, allergies, past medical history, surgical history, family history, social history and problem list reviewed and updated.  HPI  29 yom presents with sudden onset fatigue and chills today. Coughing today, non productive. Has not checked temp. Wife recently diagnosed with flu and son in clinic with him with positive flu a test. Denies cp or sob.   Review of Systems No abd pain, n//v, diarrhea.     Objective:   Physical Exam  Constitutional: He appears well-developed and well-nourished.  Non-toxic appearance. He does not have a sickly appearance. He does not appear ill. No distress.  BP 110/70 mmHg  Pulse 87  Temp(Src) 98.3 F (36.8 C) (Oral)  Resp 20  Ht 6' 3.2" (1.91 m)  Wt 223 lb 3.2 oz (101.243 kg)  BMI 27.75 kg/m2  SpO2 94%   HENT:  Right Ear: Tympanic membrane normal.  Left Ear: Tympanic membrane normal.  Nose: No mucosal edema or rhinorrhea. Right sinus exhibits maxillary sinus tenderness. Right sinus exhibits no frontal sinus tenderness. Left sinus exhibits no maxillary sinus tenderness and no frontal sinus tenderness.  Mouth/Throat: Uvula is midline and oropharynx is clear and moist.  Pulmonary/Chest: Effort normal and breath sounds normal. No tachypnea.  Lymphadenopathy:       Head (right side): No submental, no submandibular and no tonsillar adenopathy present.       Head (left side): No submental, no submandibular and no tonsillar adenopathy present.    He has no cervical adenopathy.   Results for orders placed or performed in visit on 11/22/15  POCT Influenza A/B  Result Value Ref Range   Influenza A, POC Positive (A) Negative   Influenza B, POC Negative Negative      Assessment & Plan:   Flu-like symptoms - Plan: POCT Influenza A/B, oseltamivir (TAMIFLU) 75 MG  capsule  Cough - Plan: HYDROcodone-homatropine (HYCODAN) 5-1.5 MG/5ML syrup --flu swab positive, discussed risk/benefit of tamiflu, symptom onset within 24 hours, pt interested in proceeding --hycodan for cough  Julieta Gutting, PA-C Physician Assistant-Certified [facility]  11/22/2015 4:36 PM

## 2017-06-09 DIAGNOSIS — B360 Pityriasis versicolor: Secondary | ICD-10-CM | POA: Diagnosis not present

## 2017-06-09 DIAGNOSIS — L821 Other seborrheic keratosis: Secondary | ICD-10-CM | POA: Diagnosis not present

## 2017-06-09 DIAGNOSIS — L720 Epidermal cyst: Secondary | ICD-10-CM | POA: Diagnosis not present

## 2017-11-01 DIAGNOSIS — D3131 Benign neoplasm of right choroid: Secondary | ICD-10-CM | POA: Diagnosis not present

## 2017-11-01 DIAGNOSIS — H2512 Age-related nuclear cataract, left eye: Secondary | ICD-10-CM | POA: Diagnosis not present

## 2017-11-01 DIAGNOSIS — H25012 Cortical age-related cataract, left eye: Secondary | ICD-10-CM | POA: Diagnosis not present

## 2017-12-14 DIAGNOSIS — H2512 Age-related nuclear cataract, left eye: Secondary | ICD-10-CM | POA: Diagnosis not present

## 2018-04-11 ENCOUNTER — Other Ambulatory Visit (INDEPENDENT_AMBULATORY_CARE_PROVIDER_SITE_OTHER): Payer: BLUE CROSS/BLUE SHIELD

## 2018-04-11 ENCOUNTER — Encounter: Payer: Self-pay | Admitting: Internal Medicine

## 2018-04-11 ENCOUNTER — Ambulatory Visit (INDEPENDENT_AMBULATORY_CARE_PROVIDER_SITE_OTHER): Payer: BLUE CROSS/BLUE SHIELD | Admitting: Internal Medicine

## 2018-04-11 VITALS — BP 130/80 | HR 69 | Temp 98.6°F | Resp 16 | Ht 75.2 in | Wt 232.0 lb

## 2018-04-11 DIAGNOSIS — R351 Nocturia: Secondary | ICD-10-CM

## 2018-04-11 DIAGNOSIS — N410 Acute prostatitis: Secondary | ICD-10-CM | POA: Diagnosis not present

## 2018-04-11 DIAGNOSIS — R300 Dysuria: Secondary | ICD-10-CM | POA: Insufficient documentation

## 2018-04-11 DIAGNOSIS — N401 Enlarged prostate with lower urinary tract symptoms: Secondary | ICD-10-CM | POA: Insufficient documentation

## 2018-04-11 LAB — POC URINALSYSI DIPSTICK (AUTOMATED)
Bilirubin, UA: NEGATIVE
Blood, UA: NEGATIVE
Glucose, UA: NEGATIVE
Ketones, UA: NEGATIVE
Leukocytes, UA: NEGATIVE
Nitrite, UA: NEGATIVE
Protein, UA: NEGATIVE
Spec Grav, UA: 1.025 (ref 1.010–1.025)
Urobilinogen, UA: 0.2 E.U./dL
pH, UA: 6 (ref 5.0–8.0)

## 2018-04-11 LAB — CBC WITH DIFFERENTIAL/PLATELET
BASOS PCT: 0.9 % (ref 0.0–3.0)
Basophils Absolute: 0 10*3/uL (ref 0.0–0.1)
EOS PCT: 0.6 % (ref 0.0–5.0)
Eosinophils Absolute: 0 10*3/uL (ref 0.0–0.7)
HCT: 45.7 % (ref 39.0–52.0)
Hemoglobin: 15.4 g/dL (ref 13.0–17.0)
LYMPHS ABS: 1.5 10*3/uL (ref 0.7–4.0)
Lymphocytes Relative: 29 % (ref 12.0–46.0)
MCHC: 33.7 g/dL (ref 30.0–36.0)
MCV: 97.2 fl (ref 78.0–100.0)
Monocytes Absolute: 0.5 10*3/uL (ref 0.1–1.0)
Monocytes Relative: 9 % (ref 3.0–12.0)
NEUTROS ABS: 3.2 10*3/uL (ref 1.4–7.7)
NEUTROS PCT: 60.5 % (ref 43.0–77.0)
PLATELETS: 208 10*3/uL (ref 150.0–400.0)
RBC: 4.7 Mil/uL (ref 4.22–5.81)
RDW: 13.4 % (ref 11.5–15.5)
WBC: 5.4 10*3/uL (ref 4.0–10.5)

## 2018-04-11 MED ORDER — SULFAMETHOXAZOLE-TRIMETHOPRIM 800-160 MG PO TABS: 1.0000 | ORAL_TABLET | Freq: Two times a day (BID) | ORAL | 1 refills | Status: DC

## 2018-04-11 MED ORDER — SILODOSIN 4 MG PO CAPS
4.0000 mg | ORAL_CAPSULE | Freq: Every day | ORAL | 1 refills | Status: DC
Start: 2018-04-11 — End: 2018-05-23

## 2018-04-11 NOTE — Patient Instructions (Signed)
Prostatitis Prostatitis is swelling or inflammation of the prostate gland. The prostate is a walnut-sized gland that is involved in the production of semen. It is located below a man's bladder, in front of the rectum. There are four types of prostatitis:  Chronic nonbacterial prostatitis. This is the most common type of prostatitis. It may be associated with a viral infection or autoimmune disorder.  Acute bacterial prostatitis. This is the least common type of prostatitis. It starts quickly and is usually associated with a bladder infection, high fever, and shaking chills. It can occur at any age.  Chronic bacterial prostatitis. This type usually results from acute bacterial prostatitis that happens repeatedly (is recurrent) or has not been treated properly. It can occur in men of any age but is most common among middle-aged men whose prostate has begun to get larger. The symptoms are not as severe as symptoms caused by acute bacterial prostatitis.  Prostatodynia or chronic pelvic pain syndrome (CPPS). This type is also called pelvic floor disorder. It is associated with increased muscular tone in the pelvis surrounding the prostate. What are the causes? Bacterial prostatitis is caused by infection from bacteria. Chronic nonbacterial prostatitis may be caused by:  Urinary tract infections (UTIs).  Nerve damage.  A response by the body's disease-fighting system (autoimmune response).  Chemicals in the urine. The causes of the other types of prostatitis are usually not known. What are the signs or symptoms? Symptoms of this condition vary depending upon the type of prostatitis. If you have acute bacterial prostatitis, you may experience:  Urinary symptoms, such as:  Painful urination.  Burning during urination.  Frequent and sudden urges to urinate.  Inability to start urinating.  A weak or interrupted stream of urine.  Vomiting.  Nausea.  Fever.  Chills.  Inability to  empty the bladder completely.  Pain in the:  Muscles or joints.  Lower back.  Lower abdomen. If you have any of the other types of prostatitis, you may experience:  Urinary symptoms, such as:  Sudden urges to urinate.  Frequent urination.  Difficulty starting urination.  Weak urine stream.  Dribbling after urination.  Discharge from the urethra. The urethra is a tube that opens at the end of the penis.  Pain in the:  Testicles.  Penis or tip of the penis.  Rectum.  Area in front of the rectum and below the scrotum (perineum).  Problems with sexual function.  Painful ejaculation.  Bloody semen. How is this diagnosed? This condition may be diagnosed based on:  A physical and medical exam.  Your symptoms.  A urine test to check for bacteria.  An exam in which a health care provider uses a finger to feel the prostate (digital rectal exam).  A test of a sample of semen.  Blood tests.  Ultrasound.  Removal of prostate tissue to be examined under a microscope (biopsy).  Tests to check how your body handles urine (urodynamic tests).  A test to look inside your bladder or urethra (cystoscopy). How is this treated? Treatment for this condition depends on the type of prostatitis. Treatment may involve:  Medicines to relieve pain or inflammation.  Medicines to help relax your muscles.  Physical therapy.  Heat therapy.  Techniques to help you control certain body functions (biofeedback).  Relaxation exercises.  Antibiotic medicine, if your condition is caused by bacteria.  Warm water baths (sitz baths). Sitz baths help with relaxing your pelvic floor muscles, which helps to relieve pressure on the prostate. Follow   these instructions at home:  Take over-the-counter and prescription medicines only as told by your health care provider.  If you were prescribed an antibiotic, take it as told by your health care provider. Do not stop taking the  antibiotic even if you start to feel better.  If physical therapy, biofeedback, or relaxation exercises were prescribed, do exercises as instructed.  Take sitz baths as directed by your health care provider. For a sitz bath, sit in warm water that is deep enough to cover your hips and buttocks.  Keep all follow-up visits as told by your health care provider. This is important. Contact a health care provider if:  Your symptoms get worse.  You have a fever. Get help right away if:  You have chills.  You feel nauseous.  You vomit.  You feel light-headed or feel like you are going to faint.  You are unable to urinate.  You have blood or blood clots in your urine. This information is not intended to replace advice given to you by your health care provider. Make sure you discuss any questions you have with your health care provider. Document Released: 09/18/2000 Document Revised: 06/11/2016 Document Reviewed: 06/11/2016 Elsevier Interactive Patient Education  2017 [employer]  

## 2018-04-11 NOTE — Progress Notes (Signed)
Subjective:  Patient ID: Dakota Walker, male    DOB: 05-21-1966  Age: 52 y.o. MRN: [id number]  CC: Dysuria   HPI Dakota Walker presents for a 3-week history of dysuria, nocturia, weak urine stream, dribbling, and urinary hesitation.  Outpatient Medications Prior to Visit  Medication Sig Dispense Refill  . HYDROcodone-homatropine (HYCODAN) 5-1.5 MG/5ML syrup Take 5 mLs by mouth every 8 (eight) hours as needed for cough. 120 mL 0  . oseltamivir (TAMIFLU) 75 MG capsule Take 1 capsule (75 mg total) by mouth 2 (two) times daily. 10 capsule 0   No facility-administered medications prior to visit.     ROS Review of Systems  Constitutional: Negative for chills, fatigue and fever.  HENT: Negative.   Eyes: Negative for visual disturbance.  Respiratory: Negative for cough, chest tightness, shortness of breath and wheezing.   Cardiovascular: Negative for chest pain, palpitations and leg swelling.  Gastrointestinal: Negative for abdominal pain, constipation, diarrhea, nausea and vomiting.  Genitourinary: Positive for difficulty urinating and dysuria. Negative for decreased urine volume, discharge, flank pain, frequency, hematuria, penile pain, penile swelling, scrotal swelling, testicular pain and urgency.  Musculoskeletal: Negative.  Negative for back pain.  Skin: Negative.  Negative for rash.  Neurological: Negative.  Negative for dizziness, weakness and light-headedness.  Hematological: Negative.  Negative for adenopathy. Does not bruise/bleed easily.  Psychiatric/Behavioral: Negative.  Negative for suicidal ideas.    Objective:  BP 130/80 (BP Location: Left Arm, Patient Position: Sitting, Cuff Size: Large)   Pulse 69   Temp 98.6 F (37 C) (Oral)   Resp 16   Ht 6' 3.2" (1.91 m)   Wt 232 lb (105.2 kg)   SpO2 94%   BMI 28.84 kg/m   BP Readings from Last 3 Encounters:  04/11/18 130/80  11/22/15 110/70  01/15/15 120/76    Wt Readings from Last 3 Encounters:    04/11/18 232 lb (105.2 kg)  11/22/15 223 lb 3.2 oz (101.2 kg)  01/15/15 222 lb (100.7 kg)    Physical Exam  Constitutional: No distress.  HENT:  Mouth/Throat: No oropharyngeal exudate.  Eyes: Conjunctivae are normal. No scleral icterus.  Neck: Normal range of motion. Neck supple. No JVD present. No thyromegaly present.  Cardiovascular: Normal rate, regular rhythm and normal heart sounds.  No murmur heard. Pulmonary/Chest: Effort normal and breath sounds normal. He has no wheezes. He has no rales.  Abdominal: Soft. Normal appearance and bowel sounds are normal. He exhibits no mass. There is no hepatosplenomegaly. There is no tenderness. No hernia. Hernia confirmed negative in the right inguinal area and confirmed negative in the left inguinal area.  Genitourinary: Rectum normal, testes normal and penis normal. Rectal exam shows no external hemorrhoid, no internal hemorrhoid, no fissure, no mass, no tenderness, anal tone normal and guaiac negative stool. Prostate is enlarged and tender. Right testis shows no mass, no swelling and no tenderness. Left testis shows no mass, no swelling and no tenderness. Circumcised. No penile erythema or penile tenderness. No discharge found.  Genitourinary Comments: 3+ BPH, symmetrical, boggy, tender.  Musculoskeletal: Normal range of motion. He exhibits no edema, tenderness or deformity.  Lymphadenopathy:    He has no cervical adenopathy. No inguinal adenopathy noted on the right or left side.  Skin: Skin is warm and dry. No rash noted. He is not diaphoretic.  Vitals reviewed.   Lab Results  Component Value Date   WBC 5.4 04/11/2018   HGB 15.4 04/11/2018   HCT 45.7 04/11/2018   PLT 208.0  04/11/2018   GLUCOSE 76 11/08/2014   CHOL 169 11/08/2014   TRIG 47.0 11/08/2014   HDL 44.60 11/08/2014   LDLDIRECT 177.1 11/27/2011   LDLCALC 115 (H) 11/08/2014   ALT 16 11/08/2014   AST 19 11/08/2014   NA 137 11/08/2014   K 4.7 11/08/2014   CL 104 11/08/2014    CREATININE 1.24 11/08/2014   BUN 25 (H) 11/08/2014   CO2 27 11/08/2014   TSH 1.22 11/08/2014   PSA 2.83 11/27/2011    Dg Hip Unilat With Pelvis 2-3 Views Right  Result Date: 11/09/2014 CLINICAL DATA:  53 year old male with right hip pain with no known injury. Pain at night. Initial encounter. EXAM: RIGHT HIP (WITH PELVIS) 2-3 VIEWS COMPARISON:  None. FINDINGS: Bone mineralization is within normal limits. Femoral heads are normally located. Hip joint spaces appear symmetric and within normal limits. Pelvis intact. sacral ala and SI joints within normal limits. Proximal left femur within normal limits. Proximal right femur is intact in within normal limits. IMPRESSION: Negative radiographic appearance of the right hip and pelvis. Electronically Signed   By: Lars Pinks M.D.   On: 11/09/2014 08:56    Assessment & Plan:   Dakota Walker was seen today for dysuria.  Diagnoses and all orders for this visit:  Dysuria -     Cancel: CULTURE, URINE COMPREHENSIVE; Future -     POCT Urinalysis Dipstick (Automated)  Prostatitis, acute- His symptoms and exam are consistent with acute bacterial prostatitis.  I will treat him with a 30-day course of Bactrim DS twice a day. -     CBC with Differential/Platelet; Future -     PSA, total and free; Future -     Cancel: CULTURE, URINE COMPREHENSIVE; Future -     sulfamethoxazole-trimethoprim (BACTRIM DS,SEPTRA DS) 800-160 MG tablet; Take 1 tablet by mouth 2 (two) times daily.  BPH associated with nocturia- I will screen him for prostate cancer with a PSA and have asked him to start taking a peripheral alpha blocker to alleviate some of his urinary symptoms while the prostate gland infection is being treated. -     silodosin (RAPAFLO) 4 MG CAPS capsule; Take 1 capsule (4 mg total) by mouth daily with breakfast.   I have discontinued Dakota Walker's HYDROcodone-homatropine and oseltamivir. I am also having him start on sulfamethoxazole-trimethoprim and  silodosin.  Meds ordered this encounter  Medications  . sulfamethoxazole-trimethoprim (BACTRIM DS,SEPTRA DS) 800-160 MG tablet    Sig: Take 1 tablet by mouth 2 (two) times daily.    Dispense:  60 tablet    Refill:  1  . silodosin (RAPAFLO) 4 MG CAPS capsule    Sig: Take 1 capsule (4 mg total) by mouth daily with breakfast.    Dispense:  90 capsule    Refill:  1     Follow-up: Return in about 6 weeks (around 05/23/2018).  Scarlette Calico, MD

## 2018-04-12 ENCOUNTER — Encounter: Payer: Self-pay | Admitting: Internal Medicine

## 2018-04-12 LAB — PSA, TOTAL AND FREE
PSA, % FREE: 28 % (ref >25)
PSA, FREE: 1.7 ng/mL
PSA, Total: 6 ng/mL — ABNORMAL HIGH (ref <=4.0)

## 2018-04-12 LAB — PSA: PSA: 6

## 2018-05-23 ENCOUNTER — Ambulatory Visit (INDEPENDENT_AMBULATORY_CARE_PROVIDER_SITE_OTHER): Payer: BLUE CROSS/BLUE SHIELD | Admitting: Internal Medicine

## 2018-05-23 ENCOUNTER — Encounter: Payer: Self-pay | Admitting: Internal Medicine

## 2018-05-23 ENCOUNTER — Other Ambulatory Visit (INDEPENDENT_AMBULATORY_CARE_PROVIDER_SITE_OTHER): Payer: BLUE CROSS/BLUE SHIELD

## 2018-05-23 VITALS — BP 120/70 | HR 55 | Temp 97.8°F | Resp 16 | Ht 75.2 in | Wt 231.0 lb

## 2018-05-23 DIAGNOSIS — I83893 Varicose veins of bilateral lower extremities with other complications: Secondary | ICD-10-CM | POA: Diagnosis not present

## 2018-05-23 DIAGNOSIS — E7800 Pure hypercholesterolemia, unspecified: Secondary | ICD-10-CM | POA: Diagnosis not present

## 2018-05-23 DIAGNOSIS — N410 Acute prostatitis: Secondary | ICD-10-CM

## 2018-05-23 DIAGNOSIS — Z0000 Encounter for general adult medical examination without abnormal findings: Secondary | ICD-10-CM

## 2018-05-23 DIAGNOSIS — R972 Elevated prostate specific antigen [PSA]: Secondary | ICD-10-CM | POA: Diagnosis not present

## 2018-05-23 DIAGNOSIS — Z1212 Encounter for screening for malignant neoplasm of rectum: Secondary | ICD-10-CM

## 2018-05-23 DIAGNOSIS — Z1211 Encounter for screening for malignant neoplasm of colon: Secondary | ICD-10-CM

## 2018-05-23 LAB — URINALYSIS, ROUTINE W REFLEX MICROSCOPIC
Bilirubin Urine: NEGATIVE
Hgb urine dipstick: NEGATIVE
Ketones, ur: NEGATIVE
Leukocytes, UA: NEGATIVE
Nitrite: NEGATIVE
PH: 6.5 (ref 5.0–8.0)
RBC / HPF: NONE SEEN (ref 0–?)
TOTAL PROTEIN, URINE-UPE24: NEGATIVE
Urine Glucose: NEGATIVE
Urobilinogen, UA: 0.2 (ref 0.0–1.0)

## 2018-05-23 LAB — LIPID PANEL
CHOL/HDL RATIO: 3
Cholesterol: 182 mg/dL (ref 0–200)
HDL: 67.8 mg/dL (ref >39.00)
LDL CALC: 106 mg/dL — AB (ref 0–99)
NonHDL: 114.53
TRIGLYCERIDES: 44 mg/dL (ref 0.0–149.0)
VLDL: 8.8 mg/dL (ref 0.0–40.0)

## 2018-05-23 LAB — COMPREHENSIVE METABOLIC PANEL
ALT: 19 U/L (ref 0–53)
AST: 19 U/L (ref 0–37)
Albumin: 4.1 g/dL (ref 3.5–5.2)
Alkaline Phosphatase: 45 U/L (ref 39–117)
BUN: 14 mg/dL (ref 6–23)
CO2: 29 mEq/L (ref 19–32)
Calcium: 9.2 mg/dL (ref 8.4–10.5)
Chloride: 106 mEq/L (ref 96–112)
Creatinine, Ser: 1.09 mg/dL (ref 0.40–1.50)
GFR: 75.85 mL/min (ref >60.00)
GLUCOSE: 95 mg/dL (ref 70–99)
POTASSIUM: 4.1 meq/L (ref 3.5–5.1)
Sodium: 139 mEq/L (ref 135–145)
Total Bilirubin: 0.7 mg/dL (ref 0.2–1.2)
Total Protein: 6.4 g/dL (ref 6.0–8.3)

## 2018-05-23 LAB — PSA: PSA: 7

## 2018-05-23 LAB — TSH: TSH: 1.25 u[IU]/mL (ref 0.35–4.50)

## 2018-05-23 NOTE — Patient Instructions (Signed)

## 2018-05-23 NOTE — Progress Notes (Signed)
Subjective:  Patient ID: Dakota Walker, male    DOB: [birth year]  Age: 52 y.o. MRN: [id number]  CC: Annual Exam   HPI Dakota Walker presents for a CPX.  He complains of the varicose veins in his legs are getting larger and he feels a heavy sensation with activity.  He has completed the antibiotics for the prostatitis and tells me that all of his symptoms have resolved.  He denies dysuria or hematuria.  He does not have any symptoms of obstructive uropathy.  Outpatient Medications Prior to Visit  Medication Sig Dispense Refill  . silodosin (RAPAFLO) 4 MG CAPS capsule Take 1 capsule (4 mg total) by mouth daily with breakfast. 90 capsule 1  . sulfamethoxazole-trimethoprim (BACTRIM DS,SEPTRA DS) 800-160 MG tablet Take 1 tablet by mouth 2 (two) times daily. 60 tablet 1   No facility-administered medications prior to visit.     ROS Review of Systems  Constitutional: Negative.  Negative for chills, fatigue and fever.  HENT: Negative.   Eyes: Negative for visual disturbance.  Respiratory: Negative for cough, chest tightness, shortness of breath and wheezing.   Cardiovascular: Negative for chest pain, palpitations and leg swelling.  Gastrointestinal: Negative for abdominal pain, diarrhea, nausea and vomiting.  Endocrine: Negative.   Genitourinary: Negative.  Negative for difficulty urinating, dysuria, frequency and hematuria.  Musculoskeletal: Negative.  Negative for arthralgias and myalgias.  Skin: Negative for pallor and rash.  Neurological: Negative.  Negative for dizziness, weakness and light-headedness.  Hematological: Negative for adenopathy. Does not bruise/bleed easily.  Psychiatric/Behavioral: Negative.     Objective:  BP 120/70 (BP Location: Left Arm, Patient Position: Sitting, Cuff Size: Large)   Pulse (!) 55   Temp 97.8 F (36.6 C) (Oral)   Resp 16   Ht 6' 3.2" (1.91 m)   Wt 231 lb (104.8 kg)   SpO2 96%   BMI 28.72 kg/m   BP Readings from Last 3  Encounters:  05/23/18 120/70  04/11/18 130/80  11/22/15 110/70    Wt Readings from Last 3 Encounters:  05/23/18 231 lb (104.8 kg)  04/11/18 232 lb (105.2 kg)  11/22/15 223 lb 3.2 oz (101.2 kg)    Physical Exam  Constitutional: He is oriented to person, place, and time. No distress.  HENT:  Mouth/Throat: Oropharynx is clear and moist. No oropharyngeal exudate.  Eyes: Conjunctivae are normal. No scleral icterus.  Neck: Normal range of motion. Neck supple. No JVD present. No thyromegaly present.  Cardiovascular: Normal rate and normal heart sounds. Exam reveals no gallop and no friction rub.  No murmur heard. Pulmonary/Chest: Effort normal and breath sounds normal. No respiratory distress. He has no wheezes. He has no rales.  Abdominal: Soft. Bowel sounds are normal. He exhibits no mass. There is no hepatosplenomegaly. There is no tenderness.  Genitourinary:  Genitourinary Comments: GU and rectal exams were deferred since they were just done a month ago.  Musculoskeletal: Normal range of motion. He exhibits no edema, tenderness or deformity.  There are large, uncomplicated varicose veins covering both lower extremities  Lymphadenopathy:    He has no cervical adenopathy.  Neurological: He is alert and oriented to person, place, and time.  Skin: Skin is warm and dry. No rash noted. He is not diaphoretic.  Vitals reviewed.   Lab Results  Component Value Date   WBC 5.4 04/11/2018   HGB 15.4 04/11/2018   HCT 45.7 04/11/2018   PLT 208.0 04/11/2018   GLUCOSE 95 05/23/2018   CHOL 182 05/23/2018  TRIG 44.0 05/23/2018   HDL 67.80 05/23/2018   LDLDIRECT 177.1 11/27/2011   LDLCALC 106 (H) 05/23/2018   ALT 19 05/23/2018   AST 19 05/23/2018   NA 139 05/23/2018   K 4.1 05/23/2018   CL 106 05/23/2018   CREATININE 1.09 05/23/2018   BUN 14 05/23/2018   CO2 29 05/23/2018   TSH 1.25 05/23/2018   PSA 7.0 05/23/2018    Dg Hip Unilat With Pelvis 2-3 Views Right  Result Date:  11/09/2014 CLINICAL DATA:  52 year old male with right hip pain with no known injury. Pain at night. Initial encounter. EXAM: RIGHT HIP (WITH PELVIS) 2-3 VIEWS COMPARISON:  None. FINDINGS: Bone mineralization is within normal limits. Femoral heads are normally located. Hip joint spaces appear symmetric and within normal limits. Pelvis intact. sacral ala and SI joints within normal limits. Proximal left femur within normal limits. Proximal right femur is intact in within normal limits. IMPRESSION: Negative radiographic appearance of the right hip and pelvis. Electronically Signed   By: Lars Pinks M.D.   On: 11/09/2014 08:56    Assessment & Plan:   Dakota Walker was seen today for annual exam.  Diagnoses and all orders for this visit:  Colon cancer screening -     Cologuard  Screening for malignant neoplasm of the rectum -     Cologuard  Pure hypercholesterolemia- He does not have an elevated ASCVD risk score so I do not recommend a statin for CV risk reduction. -     TSH; Future -     Comprehensive metabolic panel; Future  Routine general medical examination at a health care facility- Exam completed, labs reviewed, vaccines reviewed and updated, Cologuard was ordered to screen for colon cancer/polyps, patient education material was given. -     Lipid panel; Future  PSA elevation- His PSA is up to 7.  I Have asked him to see urology to see see if he needs to be screened for prostate cancer. -     PSA, total and free; Future -     Ambulatory referral to Urology  Prostatitis, acute- Based on his symptoms and urinalysis the infection has been adequately treated. -     Urinalysis, Routine w reflex microscopic; Future  Varicose veins of both lower extremities with complications -     Ambulatory referral to Vascular Surgery   I have discontinued [name]'s sulfamethoxazole-trimethoprim and silodosin.  No orders of the defined types were placed in this encounter.    Follow-up: Return  if symptoms worsen or fail to improve.  Scarlette Calico, MD

## 2018-05-24 ENCOUNTER — Encounter: Payer: Self-pay | Admitting: Internal Medicine

## 2018-05-24 LAB — PSA, TOTAL AND FREE
PSA, % Free: 19 % (calc) — ABNORMAL LOW (ref >25)
PSA, FREE: 1.3 ng/mL
PSA, TOTAL: 7 ng/mL — AB (ref <=4.0)

## 2018-05-30 ENCOUNTER — Other Ambulatory Visit: Payer: Self-pay | Admitting: Internal Medicine

## 2018-05-30 DIAGNOSIS — N410 Acute prostatitis: Secondary | ICD-10-CM

## 2018-05-30 MED ORDER — SULFAMETHOXAZOLE-TRIMETHOPRIM 800-160 MG PO TABS: 1.0000 | ORAL_TABLET | Freq: Two times a day (BID) | ORAL | 0 refills | Status: AC

## 2018-05-31 ENCOUNTER — Other Ambulatory Visit: Payer: Self-pay

## 2018-05-31 DIAGNOSIS — N401 Enlarged prostate with lower urinary tract symptoms: Secondary | ICD-10-CM | POA: Diagnosis not present

## 2018-05-31 DIAGNOSIS — R972 Elevated prostate specific antigen [PSA]: Secondary | ICD-10-CM | POA: Diagnosis not present

## 2018-05-31 DIAGNOSIS — R3915 Urgency of urination: Secondary | ICD-10-CM | POA: Diagnosis not present

## 2018-05-31 DIAGNOSIS — I83893 Varicose veins of bilateral lower extremities with other complications: Secondary | ICD-10-CM

## 2018-07-01 DIAGNOSIS — N401 Enlarged prostate with lower urinary tract symptoms: Secondary | ICD-10-CM | POA: Diagnosis not present

## 2018-07-11 DIAGNOSIS — R972 Elevated prostate specific antigen [PSA]: Secondary | ICD-10-CM | POA: Diagnosis not present

## 2018-07-11 DIAGNOSIS — N401 Enlarged prostate with lower urinary tract symptoms: Secondary | ICD-10-CM | POA: Diagnosis not present

## 2018-07-11 DIAGNOSIS — R3915 Urgency of urination: Secondary | ICD-10-CM | POA: Diagnosis not present

## 2018-07-22 ENCOUNTER — Other Ambulatory Visit: Payer: Self-pay

## 2018-07-22 ENCOUNTER — Ambulatory Visit (HOSPITAL_COMMUNITY)
Admission: RE | Admit: 2018-07-22 | Discharge: 2018-07-22 | Disposition: A | Discharge status: Home / Self Care | Payer: BLUE CROSS/BLUE SHIELD | Source: Ambulatory Visit | Attending: Vascular Surgery | Admitting: Vascular Surgery

## 2018-07-22 ENCOUNTER — Ambulatory Visit (INDEPENDENT_AMBULATORY_CARE_PROVIDER_SITE_OTHER): Payer: BLUE CROSS/BLUE SHIELD | Admitting: Vascular Surgery

## 2018-07-22 ENCOUNTER — Encounter: Payer: Self-pay | Admitting: Vascular Surgery

## 2018-07-22 VITALS — BP 125/77 | HR 57 | Resp 20 | Ht 75.2 in | Wt 232.0 lb

## 2018-07-22 DIAGNOSIS — I83893 Varicose veins of bilateral lower extremities with other complications: Secondary | ICD-10-CM

## 2018-07-22 NOTE — Progress Notes (Signed)
Patient ID: Dakota Walker, male   DOB: 1965/11/24, 52 y.o.   MRN: [id number]  Reason for Consult: New Patient (Initial Visit) (eval bil vv's w/ pain - Dr. Ronnald Ramp)   Referred by Janith Lima, MD  Subjective:     HPI:  Dakota Walker is a 52 y.o. male with a 30-year history of bilateral lower extremity varicose veins.  He has never had intervention.  He does have swelling and pain related to the varicose veins mostly his legs feel heavy.  He has never had a bleeding event.  He does not wear compression stockings.  He is a runner.  He recently found out the possibly something could be done for these and he is now here to discuss.  Past Medical History:  Diagnosis Date  . Cataract   . Glaucoma   . Prostatitis 09/2011   Family History  Problem Relation Age of Onset  . Cancer Other        Lung Cancer  . Heart disease Other   . Hypertension Other   . Hypertension Father   . Alcohol abuse Neg Hx   . Hyperlipidemia Neg Hx   . Stroke Neg Hx    Past Surgical History:  Procedure Laterality Date  . APPENDECTOMY  1990  . EYE SURGERY      Short Social History:  Social History   Tobacco Use  . Smoking status: Never Smoker  . Smokeless tobacco: Never Used  Substance Use Topics  . Alcohol use: Yes    Alcohol/week: 12.0 standard drinks    Types: 12 Cans of beer per week    No Known Allergies  Current Outpatient Medications  Medication Sig Dispense Refill  . silodosin (RAPAFLO) 4 MG CAPS capsule Take 4 mg by mouth daily with breakfast.     No current facility-administered medications for this visit.     Review of Systems  Constitutional:  Constitutional negative. HENT: HENT negative.  Eyes: Eyes negative.  Cardiovascular: Positive for leg swelling.  GI: Gastrointestinal negative.  Musculoskeletal: Positive for leg pain.  Skin: Skin negative.  Neurological: Neurological negative. Hematologic: Hematologic/lymphatic negative.  Psychiatric: Psychiatric  negative.        Objective:  Objective   Vitals:   07/22/18 0839  BP: 125/77  Pulse: (!) 57  Resp: 20  SpO2: 98%  Weight: 232 lb (105.2 kg)  Height: 6' 3.2" (1.91 m)   Body mass index is 28.84 kg/m.  Physical Exam  Constitutional: He is oriented to person, place, and time. He appears well-developed.  HENT:  Head: Normocephalic.  Eyes: Pupils are equal, round, and reactive to light.  Neck: Normal range of motion.  Cardiovascular: Normal rate.  Pulses:      Popliteal pulses are 2+ on the right side, and 2+ on the left side.       Dorsalis pedis pulses are 2+ on the right side, and 2+ on the left side.       Posterior tibial pulses are 2+ on the right side, and 2+ on the left side.  Pulmonary/Chest: Effort normal.  Abdominal: Soft.  Musculoskeletal: He exhibits edema.  Large varicose veins bilaterally that are medially on his legs around his knees extending down into his lower legs  Neurological: He is alert and oriented to person, place, and time.  Skin: Skin is warm and dry. Capillary refill takes less than 2 seconds.  Psychiatric: He has a normal mood and affect. His behavior is normal. Judgment and thought content normal.  Data: I have independently interpreted his lower extremity reflux studies which demonstrate greater saphenous veins on the right 1.3 cm in the left 1.8 cm at the junction with reflux in the bilateral thighs and large varicosities noted bilaterally near the knees     Assessment/Plan:     52 year old male with C3 venous disease bilateral lower extremity swelling with heaviness and very large varicosities as well as venous insufficiency has bilateral lower extremity greater saphenous veins.  I discussed with him the need for thigh-high compression stockings and he will get those today.  He will follow-up in 3 months for consideration of intervention.     Waynetta Sandy MD [facility]

## 2018-09-29 ENCOUNTER — Telehealth: Payer: Self-pay | Admitting: Vascular Surgery

## 2018-09-29 NOTE — Telephone Encounter (Signed)
resch appt spk to pt 10/27/2018 230pm f/u MD

## 2018-10-11 DIAGNOSIS — R972 Elevated prostate specific antigen [PSA]: Secondary | ICD-10-CM | POA: Diagnosis not present

## 2018-10-11 LAB — PSA: PSA: 4.84

## 2018-10-24 DIAGNOSIS — R3915 Urgency of urination: Secondary | ICD-10-CM | POA: Diagnosis not present

## 2018-10-24 DIAGNOSIS — R972 Elevated prostate specific antigen [PSA]: Secondary | ICD-10-CM | POA: Diagnosis not present

## 2018-10-24 DIAGNOSIS — N401 Enlarged prostate with lower urinary tract symptoms: Secondary | ICD-10-CM | POA: Diagnosis not present

## 2018-10-26 ENCOUNTER — Ambulatory Visit: Payer: BLUE CROSS/BLUE SHIELD | Admitting: Vascular Surgery

## 2018-10-27 ENCOUNTER — Ambulatory Visit (INDEPENDENT_AMBULATORY_CARE_PROVIDER_SITE_OTHER): Payer: BLUE CROSS/BLUE SHIELD | Admitting: Vascular Surgery

## 2018-10-27 ENCOUNTER — Other Ambulatory Visit: Payer: Self-pay

## 2018-10-27 ENCOUNTER — Encounter: Payer: Self-pay | Admitting: Vascular Surgery

## 2018-10-27 VITALS — BP 120/75 | HR 71 | Temp 97.3°F | Resp 18 | Ht 75.0 in | Wt 230.0 lb

## 2018-10-27 DIAGNOSIS — I83893 Varicose veins of bilateral lower extremities with other complications: Secondary | ICD-10-CM

## 2018-10-27 NOTE — Progress Notes (Signed)
Patient name: Dakota Walker MRN: [id number] DOB: 29-Mar-1966 Sex: male  REASON FOR VISIT:   Follow-up of painful bilateral varicose veins.  HPI:   Dakota Walker is a pleasant 53 y.o. male who was seen by Dr. Servando Snare on 07/22/2018.  He has a greater than 30-year history of varicose veins of both lower extremities.  On exam he had some large varicose veins around his knees bilaterally which are under significant pressure.  Noninvasive studies showed significant reflux in both great saphenous veins which were dilated.  It was recommended that he wear thigh-high compression stockings with a gradient of 20 to 30 mmHg, elevate his legs, and use ibuprofen as needed for pain.  Since he was seen last, he has been wearing his thigh-high compression stockings but these do not help his symptoms significantly.  He experiences a restless feeling in his legs when he standing or sitting.  The symptoms are relieved somewhat with elevation.  He takes ibuprofen as needed for pain.  His job requires him to sit for long periods of time and this does aggravate his symptoms.  He has had varicose veins for over 30 years. He has no history of DVT.  Past Medical History:  Diagnosis Date  . Cataract   . Glaucoma   . Prostatitis 09/2011    Family History  Problem Relation Age of Onset  . Cancer Other        Lung Cancer  . Heart disease Other   . Hypertension Other   . Hypertension Father   . Alcohol abuse Neg Hx   . Hyperlipidemia Neg Hx   . Stroke Neg Hx     SOCIAL HISTORY: Social History   Tobacco Use  . Smoking status: Never Smoker  . Smokeless tobacco: Never Used  Substance Use Topics  . Alcohol use: Yes    Alcohol/week: 12.0 standard drinks    Types: 12 Cans of beer per week    No Known Allergies  Current Outpatient Medications  Medication Sig Dispense Refill  . silodosin (RAPAFLO) 4 MG CAPS capsule Take 4 mg by mouth daily with breakfast.     No current  facility-administered medications for this visit.     REVIEW OF SYSTEMS:  [X]  denotes positive finding, [ ]  denotes negative finding Cardiac  Comments:  Chest pain or chest pressure:    Shortness of breath upon exertion:    Short of breath when lying flat:    Irregular heart rhythm:        Vascular    Pain in calf, thigh, or hip brought on by ambulation: x   Pain in feet at night that wakes you up from your sleep:     Blood clot in your veins:    Leg swelling:         Pulmonary    Oxygen at home:    Productive cough:     Wheezing:         Neurologic    Sudden weakness in arms or legs:     Sudden numbness in arms or legs:     Sudden onset of difficulty speaking or slurred speech:    Temporary loss of vision in one eye:     Problems with dizziness:         Gastrointestinal    Blood in stool:     Vomited blood:         Genitourinary    Burning when urinating:     Blood in urine:  Psychiatric    Major depression:         Hematologic    Bleeding problems:    Problems with blood clotting too easily:        Skin    Rashes or ulcers:        Constitutional    Fever or chills:     PHYSICAL EXAM:   Vitals:   10/27/18 1502  BP: 120/75  Pulse: 71  Resp: 18  Temp: (!) 97.3 F (36.3 C)  TempSrc: Oral  SpO2: 98%  Weight: 230 lb (104.3 kg)  Height: 6\' 3"  (1.905 m)    GENERAL: The patient is a well-nourished male, in no acute distress. The vital signs are documented above. CARDIAC: There is a regular rate and rhythm.  VASCULAR: I do not detect carotid bruits. He has palpable femoral, dorsalis pedis, and posterior tibial pulses bilaterally. VENOUS EXAM: This patient has markedly dilated varicose veins in his medial thighs and calves bilaterally which are under significant pressure.  He does have some mild Hyperpigmentation bilaterally. I did look at both great saphenous veins with the SonoSite. On the right side, there is gross incompetence of the great  saphenous vein from the saphenofemoral junction down to the distal thigh where this feeds this large cluster of varicose veins.  The vein is significantly dilated. Likewise, on the left side the great saphenous vein is incompetent down to the distal thigh where this feeds this large cluster of varicose veins which are under significant pressure.  The vein is significantly dilated. PULMONARY: There is good air exchange bilaterally without wheezing or rales. ABDOMEN: Soft and non-tender with normal pitched bowel sounds.  MUSCULOSKELETAL: There are no major deformities or cyanosis. NEUROLOGIC: No focal weakness or paresthesias are detected. SKIN: There are no ulcers or rashes noted. PSYCHIATRIC: The patient has a normal affect.  DATA:    VENOUS DUPLEX: I reviewed his venous duplex scan that was done on 07/22/2018.    On the right side there was reflux in the deep system involving the common femoral vein.  There was reflux in the great saphenous vein from the mid thigh to the proximal saphenofemoral junction.  The vein on the right was significantly dilated.  On the left side, there was deep venous reflux involving the common femoral vein.  There was superficial venous reflux involving the great saphenous vein from the distal thigh to the saphenofemoral junction.  This vein was also significantly dilated from the distal thigh up to the saphenofemoral junction with diameters ranging from 0.76 cm to 0.87 cm.  MEDICAL ISSUES:   PAINFUL VARICOSE VEINS BILATERALLY: This patient has markedly dilated varicose veins of both lower extremities that are under significant pressure.  He also has gross incompetence of both great saphenous veins.  He has failed conservative treatment including thigh-high compression stockings, leg elevation, and ibuprofen as needed for pain.  I think he would be a good candidate for endovenous laser ablation of the great saphenous vein with greater than 20 stab phlebectomies  bilaterally.  However I have also encouraged him to continue with conservative measures.  We discussed the importance of intermittent leg elevation the proper positioning for this.  I encouraged him to wear his compression stockings especially when he standing and sitting for prolonged periods of time.  I have encouraged him to avoid prolonged sitting and standing.  We discussed the importance of exercise specifically walking and water aerobics.  He understands that venous disease is a progressive disease typically.  I have discussed the indications for endovenous laser ablation of the GSV's, that is to lower the pressure in the veins and potentially help relieve the symptoms from venous hypertension. I have also discussed alternative options including conservative treatment with leg elevation, compression therapy, exercise, avoiding prolonged sitting and standing, and weight management. I have discussed the potential complications of the procedure, including, but not limited to: bleeding, bruising, leg swelling, nerve injury, skin burns, significant pain from phlebitis, deep venous thrombosis, or failure of the vein to close.  Given how large his great saphenous veins are I think he is at slightly higher risk for failure of the vein to close.  I have also explained that venous insufficiency is a chronic disease, and that the patient is at risk for recurrent varicose veins in the future.  All of the patient's questions were encouraged and answered.  I have discussed with the patient the indications for stab phlebectomy.  I have explained to the patient that that will have small scars from the stab incisions.  I explained that the other risks include leg swelling, bruising, bleeding, and phlebitis.  All the patient's questions were encouraged and answered and they are agreeable to proceed.  Patient will call to schedule this after he has had a chance to absorb our extensive discussion.  A total of 55 minutes  was spent on this visit. 25 minutes was face to face time. More than 50% of the time was spent on counseling and coordinating with the patient.    Deitra Mayo [facility] [phone]

## 2018-11-10 DIAGNOSIS — R972 Elevated prostate specific antigen [PSA]: Secondary | ICD-10-CM | POA: Diagnosis not present

## 2019-04-20 DIAGNOSIS — L259 Unspecified contact dermatitis, unspecified cause: Secondary | ICD-10-CM | POA: Diagnosis not present

## 2019-07-07 ENCOUNTER — Other Ambulatory Visit: Payer: Self-pay

## 2019-07-07 DIAGNOSIS — Z20822 Contact with and (suspected) exposure to covid-19: Secondary | ICD-10-CM

## 2019-07-08 LAB — NOVEL CORONAVIRUS, NAA: SARS-CoV-2, NAA: NOT DETECTED

## 2019-07-12 ENCOUNTER — Other Ambulatory Visit: Payer: Self-pay

## 2019-07-12 DIAGNOSIS — Z20822 Contact with and (suspected) exposure to covid-19: Secondary | ICD-10-CM

## 2019-07-12 DIAGNOSIS — Z20828 Contact with and (suspected) exposure to other viral communicable diseases: Secondary | ICD-10-CM | POA: Diagnosis not present

## 2019-07-14 LAB — NOVEL CORONAVIRUS, NAA: SARS-CoV-2, NAA: NOT DETECTED

## 2019-10-24 DIAGNOSIS — Z20828 Contact with and (suspected) exposure to other viral communicable diseases: Secondary | ICD-10-CM | POA: Diagnosis not present

## 2019-11-15 ENCOUNTER — Ambulatory Visit: Payer: BC Managed Care – PPO | Attending: Internal Medicine

## 2019-11-15 DIAGNOSIS — Z20822 Contact with and (suspected) exposure to covid-19: Secondary | ICD-10-CM | POA: Diagnosis not present

## 2019-11-16 LAB — NOVEL CORONAVIRUS, NAA: SARS-CoV-2, NAA: NOT DETECTED

## 2019-12-08 DIAGNOSIS — R972 Elevated prostate specific antigen [PSA]: Secondary | ICD-10-CM | POA: Diagnosis not present

## 2019-12-08 DIAGNOSIS — R351 Nocturia: Secondary | ICD-10-CM | POA: Diagnosis not present

## 2019-12-08 DIAGNOSIS — N401 Enlarged prostate with lower urinary tract symptoms: Secondary | ICD-10-CM | POA: Diagnosis not present

## 2020-03-13 DIAGNOSIS — C4441 Basal cell carcinoma of skin of scalp and neck: Secondary | ICD-10-CM | POA: Diagnosis not present

## 2020-03-13 DIAGNOSIS — L820 Inflamed seborrheic keratosis: Secondary | ICD-10-CM | POA: Diagnosis not present

## 2020-04-15 DIAGNOSIS — C4441 Basal cell carcinoma of skin of scalp and neck: Secondary | ICD-10-CM | POA: Diagnosis not present

## 2020-06-19 DIAGNOSIS — N401 Enlarged prostate with lower urinary tract symptoms: Secondary | ICD-10-CM | POA: Diagnosis not present

## 2020-06-19 DIAGNOSIS — R351 Nocturia: Secondary | ICD-10-CM | POA: Diagnosis not present

## 2020-06-19 LAB — PSA: PSA: 5.93

## 2020-06-26 DIAGNOSIS — N401 Enlarged prostate with lower urinary tract symptoms: Secondary | ICD-10-CM | POA: Diagnosis not present

## 2020-06-26 DIAGNOSIS — R972 Elevated prostate specific antigen [PSA]: Secondary | ICD-10-CM | POA: Diagnosis not present

## 2020-06-26 DIAGNOSIS — R351 Nocturia: Secondary | ICD-10-CM | POA: Diagnosis not present

## 2020-06-26 DIAGNOSIS — R3915 Urgency of urination: Secondary | ICD-10-CM | POA: Diagnosis not present

## 2020-08-08 DIAGNOSIS — R3915 Urgency of urination: Secondary | ICD-10-CM | POA: Diagnosis not present

## 2020-08-08 DIAGNOSIS — N401 Enlarged prostate with lower urinary tract symptoms: Secondary | ICD-10-CM | POA: Diagnosis not present

## 2020-08-12 DIAGNOSIS — R3915 Urgency of urination: Secondary | ICD-10-CM | POA: Diagnosis not present

## 2020-08-12 DIAGNOSIS — R972 Elevated prostate specific antigen [PSA]: Secondary | ICD-10-CM | POA: Diagnosis not present

## 2020-08-12 DIAGNOSIS — N401 Enlarged prostate with lower urinary tract symptoms: Secondary | ICD-10-CM | POA: Diagnosis not present

## 2020-08-12 DIAGNOSIS — R351 Nocturia: Secondary | ICD-10-CM | POA: Diagnosis not present

## 2020-08-14 ENCOUNTER — Other Ambulatory Visit: Payer: Self-pay | Admitting: Urology

## 2020-08-14 DIAGNOSIS — R972 Elevated prostate specific antigen [PSA]: Secondary | ICD-10-CM

## 2020-09-09 ENCOUNTER — Ambulatory Visit
Admission: RE | Admit: 2020-09-09 | Discharge: 2020-09-09 | Disposition: A | Discharge status: Home / Self Care | Payer: BC Managed Care – PPO | Source: Ambulatory Visit | Attending: Urology | Admitting: Urology

## 2020-09-09 ENCOUNTER — Other Ambulatory Visit: Payer: Self-pay

## 2020-09-09 DIAGNOSIS — R937 Abnormal findings on diagnostic imaging of other parts of musculoskeletal system: Secondary | ICD-10-CM | POA: Diagnosis not present

## 2020-09-09 DIAGNOSIS — R972 Elevated prostate specific antigen [PSA]: Secondary | ICD-10-CM

## 2020-09-09 DIAGNOSIS — R590 Localized enlarged lymph nodes: Secondary | ICD-10-CM | POA: Diagnosis not present

## 2020-09-09 DIAGNOSIS — N400 Enlarged prostate without lower urinary tract symptoms: Secondary | ICD-10-CM | POA: Diagnosis not present

## 2020-09-09 MED ORDER — GADOBENATE DIMEGLUMINE 529 MG/ML IV SOLN
20.0000 mL | Freq: Once | INTRAVENOUS | Status: AC | PRN
  Administered 2020-09-09: 20 mL via INTRAVENOUS

## 2020-10-05 DIAGNOSIS — C61 Malignant neoplasm of prostate: Secondary | ICD-10-CM

## 2020-10-05 HISTORY — DX: Malignant neoplasm of prostate: C61

## 2020-11-01 DIAGNOSIS — C61 Malignant neoplasm of prostate: Secondary | ICD-10-CM | POA: Diagnosis not present

## 2020-11-01 DIAGNOSIS — R972 Elevated prostate specific antigen [PSA]: Secondary | ICD-10-CM | POA: Diagnosis not present

## 2020-11-01 DIAGNOSIS — D075 Carcinoma in situ of prostate: Secondary | ICD-10-CM | POA: Diagnosis not present

## 2020-11-13 DIAGNOSIS — N401 Enlarged prostate with lower urinary tract symptoms: Secondary | ICD-10-CM | POA: Diagnosis not present

## 2020-11-13 DIAGNOSIS — R351 Nocturia: Secondary | ICD-10-CM | POA: Diagnosis not present

## 2020-11-15 DIAGNOSIS — R351 Nocturia: Secondary | ICD-10-CM | POA: Diagnosis not present

## 2020-11-15 DIAGNOSIS — N401 Enlarged prostate with lower urinary tract symptoms: Secondary | ICD-10-CM | POA: Diagnosis not present

## 2020-11-15 DIAGNOSIS — R972 Elevated prostate specific antigen [PSA]: Secondary | ICD-10-CM | POA: Diagnosis not present

## 2020-11-15 DIAGNOSIS — C61 Malignant neoplasm of prostate: Secondary | ICD-10-CM | POA: Diagnosis not present

## 2020-12-04 DIAGNOSIS — C61 Malignant neoplasm of prostate: Secondary | ICD-10-CM | POA: Diagnosis not present

## 2021-02-11 DIAGNOSIS — C61 Malignant neoplasm of prostate: Secondary | ICD-10-CM | POA: Diagnosis not present

## 2021-04-29 DIAGNOSIS — Z85828 Personal history of other malignant neoplasm of skin: Secondary | ICD-10-CM | POA: Diagnosis not present

## 2021-04-29 DIAGNOSIS — L821 Other seborrheic keratosis: Secondary | ICD-10-CM | POA: Diagnosis not present

## 2021-04-29 DIAGNOSIS — C44319 Basal cell carcinoma of skin of other parts of face: Secondary | ICD-10-CM | POA: Diagnosis not present

## 2021-04-29 DIAGNOSIS — D225 Melanocytic nevi of trunk: Secondary | ICD-10-CM | POA: Diagnosis not present

## 2021-04-29 DIAGNOSIS — D2262 Melanocytic nevi of left upper limb, including shoulder: Secondary | ICD-10-CM | POA: Diagnosis not present

## 2021-04-29 DIAGNOSIS — L570 Actinic keratosis: Secondary | ICD-10-CM | POA: Diagnosis not present

## 2021-05-19 DIAGNOSIS — C61 Malignant neoplasm of prostate: Secondary | ICD-10-CM | POA: Diagnosis not present

## 2021-05-26 DIAGNOSIS — R972 Elevated prostate specific antigen [PSA]: Secondary | ICD-10-CM | POA: Diagnosis not present

## 2021-05-26 DIAGNOSIS — C61 Malignant neoplasm of prostate: Secondary | ICD-10-CM | POA: Diagnosis not present

## 2021-05-26 DIAGNOSIS — N401 Enlarged prostate with lower urinary tract symptoms: Secondary | ICD-10-CM | POA: Diagnosis not present

## 2021-05-26 DIAGNOSIS — R351 Nocturia: Secondary | ICD-10-CM | POA: Diagnosis not present

## 2021-06-13 DIAGNOSIS — H43393 Other vitreous opacities, bilateral: Secondary | ICD-10-CM | POA: Diagnosis not present

## 2021-07-11 ENCOUNTER — Other Ambulatory Visit: Payer: Self-pay | Admitting: Urology

## 2021-07-30 DIAGNOSIS — C44329 Squamous cell carcinoma of skin of other parts of face: Secondary | ICD-10-CM | POA: Diagnosis not present

## 2021-09-24 ENCOUNTER — Other Ambulatory Visit: Payer: Self-pay

## 2021-09-24 ENCOUNTER — Ambulatory Visit (INDEPENDENT_AMBULATORY_CARE_PROVIDER_SITE_OTHER): Payer: BC Managed Care – PPO | Admitting: Internal Medicine

## 2021-09-24 ENCOUNTER — Encounter: Payer: Self-pay | Admitting: Internal Medicine

## 2021-09-24 VITALS — BP 138/80 | HR 71 | Temp 97.8°F | Ht 75.0 in | Wt 256.0 lb

## 2021-09-24 DIAGNOSIS — R062 Wheezing: Secondary | ICD-10-CM | POA: Diagnosis not present

## 2021-09-24 DIAGNOSIS — J309 Allergic rhinitis, unspecified: Secondary | ICD-10-CM | POA: Diagnosis not present

## 2021-09-24 DIAGNOSIS — R059 Cough, unspecified: Secondary | ICD-10-CM | POA: Diagnosis not present

## 2021-09-24 DIAGNOSIS — R051 Acute cough: Secondary | ICD-10-CM | POA: Insufficient documentation

## 2021-09-24 MED ORDER — HYDROCODONE BIT-HOMATROP MBR 5-1.5 MG/5ML PO SOLN: 5.0000 mL | Freq: Four times a day (QID) | ORAL | 0 refills | Status: AC | PRN

## 2021-09-24 MED ORDER — PREDNISONE 10 MG PO TABS: ORAL_TABLET | ORAL | 0 refills | Status: DC

## 2021-09-24 MED ORDER — ALBUTEROL SULFATE HFA 108 (90 BASE) MCG/ACT IN AERS: 2.0000 | INHALATION_SPRAY | Freq: Four times a day (QID) | RESPIRATORY_TRACT | 1 refills | Status: DC | PRN

## 2021-09-24 MED ORDER — AZITHROMYCIN 250 MG PO TABS: ORAL_TABLET | ORAL | 1 refills | Status: AC

## 2021-09-24 NOTE — Assessment & Plan Note (Signed)
Also to improve with prednisone asd,  to f/u any worsening symptoms or concerns

## 2021-09-24 NOTE — Progress Notes (Signed)
Patient ID: Dakota Walker, male   DOB: August 24, 1966, 55 y.o.   MRN: [id number]        Chief Complaint: follow up cough and wheezing       HPI:  Dakota Walker is a 55 y.o. male Here with acute onset mild to mod 2-3 wks ST, HA, general weakness and malaise, with prod cough greenish sputum, but Pt denies chest pain, increased sob or doe, wheezing, orthopnea, PND, increased LE swelling, palpitations, dizziness or syncope, except for onset mild sob and wheezing last few days.   Pt denies polydipsia, polyuria, or new focal neuro s/s.   Pt denies fever, wt loss, night sweats, loss of appetite, or other constitutional symptoms Does have several wks ongoing nasal allergy symptoms with clearish congestion, itch and sneezing,       Wt Readings from Last 3 Encounters:  09/24/21 256 lb (116.1 kg)  10/27/18 230 lb (104.3 kg)  07/22/18 232 lb (105.2 kg)   BP Readings from Last 3 Encounters:  09/24/21 138/80  10/27/18 120/75  07/22/18 125/77         Past Medical History:  Diagnosis Date   Cataract    Glaucoma    Prostatitis 09/2011   Past Surgical History:  Procedure Laterality Date   [place]      reports that he has never smoked. He has never used smokeless tobacco. He reports current alcohol use of about 12.0 standard drinks per week. He reports that he does not use drugs. family history includes Cancer in an other family member; Heart disease in an other family member; Hypertension in his father and another family member. No Known Allergies Current Outpatient Medications on File Prior to Visit  Medication Sig Dispense Refill   finasteride (PROSCAR) 5 MG tablet Take 5 mg by mouth daily.     silodosin (RAPAFLO) 8 MG CAPS capsule Take 8 mg by mouth daily with breakfast.     No current facility-administered medications on file prior to visit.        ROS:  All others reviewed and negative.  Objective        PE:  BP 138/80 (BP Location: Left Arm, Patient  Position: Sitting, Cuff Size: Large)    Pulse 71    Temp 97.8 F (36.6 C) (Oral)    Ht 6\' 3"  (1.905 m)    Wt 256 lb (116.1 kg)    SpO2 96%    BMI 32.00 kg/m                 Constitutional: Pt appears in NAD               HENT: Head: NCAT.                Right Ear: External ear normal.                 Left Ear: External ear normal. Bilat tm's with mild erythema.  Max sinus areas non tender.  Pharynx with mild erythema, no exudate               Eyes: . Pupils are equal, round, and reactive to light. Conjunctivae and EOM are normal               Nose: without d/c or deformity               Neck: Neck supple. Gross normal ROM  Cardiovascular: Normal rate and regular rhythm.                 Pulmonary/Chest: Effort normal and breath sounds without rales with bilat few wheezing.                               Neurological: Pt is alert. At baseline orientation, motor grossly intact               Skin: Skin is warm. No rashes, no other new lesions, LE edema - none               Psychiatric: Pt behavior is normal without agitation   Micro: none  Cardiac tracings I have personally interpreted today:  none  Pertinent Radiological findings (summarize): none   Lab Results  Component Value Date   WBC 5.4 04/11/2018   HGB 15.4 04/11/2018   HCT 45.7 04/11/2018   PLT 208.0 04/11/2018   GLUCOSE 95 05/23/2018   CHOL 182 05/23/2018   TRIG 44.0 05/23/2018   HDL 67.80 05/23/2018   LDLDIRECT 177.1 11/27/2011   LDLCALC 106 (H) 05/23/2018   ALT 19 05/23/2018   AST 19 05/23/2018   NA 139 05/23/2018   K 4.1 05/23/2018   CL 106 05/23/2018   CREATININE 1.09 05/23/2018   BUN 14 05/23/2018   CO2 29 05/23/2018   TSH 1.25 05/23/2018   PSA 5.93 06/19/2020   Assessment/Plan:  Dakota Walker is a 55 y.o. White or Caucasian [1] male with  has a past medical history of Cataract, Glaucoma, and Prostatitis (09/2011).  Cough Mild to mod, c/w bornchitis vs pn, for antibx course, cough med  prn,  to f/u any worsening symptoms or concerns  Wheezing Mild to mod, for prednisone asd, albuterol hfa prn,,  to f/u any worsening symptoms or cocnerns  Allergic rhinitis Also to improve with prednisone asd,  to f/u any worsening symptoms or concerns  Followup: No follow-ups on file.  Dakota Cower, MD 09/24/2021 10:27 PM [facility]

## 2021-09-24 NOTE — Assessment & Plan Note (Signed)
Mild to mod, for prednisone asd, albuterol hfa prn,,  to f/u any worsening symptoms or cocnerns

## 2021-09-24 NOTE — Assessment & Plan Note (Signed)
Mild to mod, c/w bornchitis vs pn, for antibx course, cough med prn,  to f/u any worsening symptoms or concerns

## 2021-09-24 NOTE — Patient Instructions (Signed)
Please take all new medication as prescribed - the antibiotic, cough medicine, prednisone and inhaler as needed  Please continue all other medications as before, and refills have been done if requested.  Please have the pharmacy call with any other refills you may need.  Please keep your appointments with your specialists as you may have planned

## 2021-10-08 DIAGNOSIS — C61 Malignant neoplasm of prostate: Secondary | ICD-10-CM | POA: Diagnosis not present

## 2021-10-15 DIAGNOSIS — N401 Enlarged prostate with lower urinary tract symptoms: Secondary | ICD-10-CM | POA: Diagnosis not present

## 2021-10-15 DIAGNOSIS — C61 Malignant neoplasm of prostate: Secondary | ICD-10-CM | POA: Diagnosis not present

## 2021-10-15 DIAGNOSIS — R351 Nocturia: Secondary | ICD-10-CM | POA: Diagnosis not present

## 2021-10-15 DIAGNOSIS — R3915 Urgency of urination: Secondary | ICD-10-CM | POA: Diagnosis not present

## 2021-10-28 ENCOUNTER — Encounter: Payer: Self-pay | Admitting: Podiatry

## 2021-10-28 ENCOUNTER — Ambulatory Visit (INDEPENDENT_AMBULATORY_CARE_PROVIDER_SITE_OTHER): Payer: BC Managed Care – PPO

## 2021-10-28 ENCOUNTER — Ambulatory Visit (INDEPENDENT_AMBULATORY_CARE_PROVIDER_SITE_OTHER): Payer: BC Managed Care – PPO | Admitting: Podiatry

## 2021-10-28 ENCOUNTER — Other Ambulatory Visit: Payer: Self-pay

## 2021-10-28 DIAGNOSIS — M722 Plantar fascial fibromatosis: Secondary | ICD-10-CM | POA: Diagnosis not present

## 2021-10-28 DIAGNOSIS — M7750 Other enthesopathy of unspecified foot: Secondary | ICD-10-CM | POA: Diagnosis not present

## 2021-10-28 DIAGNOSIS — M7752 Other enthesopathy of left foot: Secondary | ICD-10-CM

## 2021-10-28 MED ORDER — MELOXICAM 15 MG PO TABS: 15.0000 mg | ORAL_TABLET | Freq: Every day | ORAL | 3 refills | Status: DC

## 2021-10-28 NOTE — Patient Instructions (Signed)

## 2021-10-29 ENCOUNTER — Telehealth: Payer: Self-pay | Admitting: Internal Medicine

## 2021-10-29 NOTE — Telephone Encounter (Signed)
Patient requesting order for labs prior to cpe appt on 12-16-2021

## 2021-10-31 ENCOUNTER — Other Ambulatory Visit: Payer: Self-pay | Admitting: Internal Medicine

## 2021-10-31 DIAGNOSIS — Z0000 Encounter for general adult medical examination without abnormal findings: Secondary | ICD-10-CM

## 2021-10-31 DIAGNOSIS — E7800 Pure hypercholesterolemia, unspecified: Secondary | ICD-10-CM

## 2021-10-31 DIAGNOSIS — Z1159 Encounter for screening for other viral diseases: Secondary | ICD-10-CM | POA: Insufficient documentation

## 2021-10-31 NOTE — Telephone Encounter (Signed)
Pt has been informed.

## 2021-11-02 ENCOUNTER — Encounter: Payer: Self-pay | Admitting: Podiatry

## 2021-11-02 NOTE — Progress Notes (Signed)
°  Subjective:  Patient ID: Dakota Walker, male    DOB: 08-Sep-1966,  MRN: [id number]  Chief Complaint  Patient presents with   Tendonitis     (xray)NP L Heel Pain    56 y.o. male presents with the above complaint. History confirmed with patient.  Its been going on for about a year.  He went to [place] for [employer] and was doing a lot of walking and had a recent trip to [facility] which has aggravated it.  Objective:  Physical Exam: warm, good capillary refill, no trophic changes or ulcerative lesions, normal DP and PT pulses, and normal sensory exam. Left Foot: point tenderness over the heel pad and point tenderness of the mid plantar fascia   Radiographs: Multiple views x-ray of the left foot: no fracture, dislocation, swelling or degenerative changes noted and plantar calcaneal spur Assessment:   1. Plantar fasciitis of left foot   2. Tendonitis of ankle or foot      Plan:  Patient was evaluated and treated and all questions answered.  Discussed the etiology and treatment options for plantar fasciitis including stretching, formal physical therapy, supportive shoegears such as a running shoe or sneaker, pre fabricated orthoses, injection therapy, and oral medications. We also discussed the role of surgical treatment of this for patients who do not improve after exhausting non-surgical treatment options.   -XR reviewed with patient -Educated patient on stretching and icing of the affected limb -Plantar fascial brace dispensed to support the plantar fascia medial longitudinal arch and provide compression support -Injection delivered to the plantar fascia of the left foot. -Rx for meloxicam. Educated on use, risks and benefits of the medication   Return in about 1 month (around 11/28/2021) for recheck plantar fasciitis.

## 2021-11-13 ENCOUNTER — Other Ambulatory Visit: Payer: Self-pay | Admitting: Urology

## 2021-11-13 DIAGNOSIS — R972 Elevated prostate specific antigen [PSA]: Secondary | ICD-10-CM

## 2021-11-14 DIAGNOSIS — C61 Malignant neoplasm of prostate: Secondary | ICD-10-CM | POA: Diagnosis not present

## 2021-11-14 LAB — PSA: PSA: 2

## 2021-11-28 ENCOUNTER — Ambulatory Visit
Admission: RE | Admit: 2021-11-28 | Discharge: 2021-11-28 | Disposition: A | Discharge status: Home / Self Care | Payer: BC Managed Care – PPO | Source: Ambulatory Visit | Attending: Urology | Admitting: Urology

## 2021-11-28 DIAGNOSIS — R972 Elevated prostate specific antigen [PSA]: Secondary | ICD-10-CM | POA: Diagnosis not present

## 2021-11-28 DIAGNOSIS — R590 Localized enlarged lymph nodes: Secondary | ICD-10-CM | POA: Diagnosis not present

## 2021-11-28 MED ORDER — GADOBENATE DIMEGLUMINE 529 MG/ML IV SOLN
20.0000 mL | Freq: Once | INTRAVENOUS | Status: AC | PRN
  Administered 2021-11-28: 20 mL via INTRAVENOUS

## 2021-12-02 ENCOUNTER — Other Ambulatory Visit: Payer: Self-pay

## 2021-12-02 ENCOUNTER — Ambulatory Visit (INDEPENDENT_AMBULATORY_CARE_PROVIDER_SITE_OTHER): Payer: BC Managed Care – PPO | Admitting: Podiatry

## 2021-12-02 DIAGNOSIS — M722 Plantar fascial fibromatosis: Secondary | ICD-10-CM

## 2021-12-02 NOTE — Progress Notes (Signed)
°  Subjective:  Patient ID: Dakota Walker, male    DOB: [birth year],  MRN: [id number]  Chief Complaint  Patient presents with   Plantar Fasciitis       recheck plantar fasciitis    56 y.o. male presents with the above complaint. History confirmed with patient.  Its been going on for about a year.  He went to [place] for [employer] and was doing a lot of walking and had a recent trip to [facility] which has aggravated it.  Interval history He had some improvement after the last injection.  He switched to sketcher shoes and this helped quite a bit.  Objective:  Physical Exam: warm, good capillary refill, no trophic changes or ulcerative lesions, normal DP and PT pulses, and normal sensory exam. Left Foot: Still some point tenderness of the medial insertion of the band of the plantar fascia   Radiographs: Multiple views x-ray of the left foot: no fracture, dislocation, swelling or degenerative changes noted and plantar calcaneal spur Assessment:   1. Plantar fasciitis of left foot      Plan:  Patient was evaluated and treated and all questions answered.    Doing well he should continue his home exercises meloxicam and plantar fascia brace until  following sterile prep with alcohol as noted below.  We also discussed prefabricated insoles  in different shoe gear and he will try this to see if this alleviates his issues.  I recommended [employer] or [employer] for walking or running shoes as well as Keen  hiking boots.  I will see him back as needed at this point  After sterile prep with povidone-iodine solution and alcohol, the left heel was injected with 0.5cc 2% xylocaine plain, 0.5cc 0.5% marcaine plain, 5mg  triamcinolone acetonide, and 2mg  dexamethasone was injected along the medial plantar fascia at the insertion on the plantar calcaneus. The patient tolerated the procedure well without complication.  Return if symptoms worsen or fail to improve.

## 2021-12-05 ENCOUNTER — Other Ambulatory Visit (INDEPENDENT_AMBULATORY_CARE_PROVIDER_SITE_OTHER): Payer: BC Managed Care – PPO

## 2021-12-05 DIAGNOSIS — Z1159 Encounter for screening for other viral diseases: Secondary | ICD-10-CM

## 2021-12-05 DIAGNOSIS — E7800 Pure hypercholesterolemia, unspecified: Secondary | ICD-10-CM

## 2021-12-05 DIAGNOSIS — Z0000 Encounter for general adult medical examination without abnormal findings: Secondary | ICD-10-CM | POA: Diagnosis not present

## 2021-12-05 LAB — CBC WITH DIFFERENTIAL/PLATELET
Basophils Absolute: 0 10*3/uL (ref 0.0–0.1)
Basophils Relative: 0.7 % (ref 0.0–3.0)
Eosinophils Absolute: 0.1 10*3/uL (ref 0.0–0.7)
Eosinophils Relative: 1.8 % (ref 0.0–5.0)
HCT: 42.6 % (ref 39.0–52.0)
Hemoglobin: 14.8 g/dL (ref 13.0–17.0)
Lymphocytes Relative: 30.9 % (ref 12.0–46.0)
Lymphs Abs: 1.3 10*3/uL (ref 0.7–4.0)
MCHC: 34.7 g/dL (ref 30.0–36.0)
MCV: 92.2 fl (ref 78.0–100.0)
Monocytes Absolute: 0.7 10*3/uL (ref 0.1–1.0)
Monocytes Relative: 15.7 % — ABNORMAL HIGH (ref 3.0–12.0)
Neutro Abs: 2.1 10*3/uL (ref 1.4–7.7)
Neutrophils Relative %: 50.9 % (ref 43.0–77.0)
Platelets: 167 10*3/uL (ref 150.0–400.0)
RBC: 4.62 Mil/uL (ref 4.22–5.81)
RDW: 13.2 % (ref 11.5–15.5)
WBC: 4.2 10*3/uL (ref 4.0–10.5)

## 2021-12-05 LAB — LIPID PANEL
Cholesterol: 170 mg/dL (ref 0–200)
HDL: 36.7 mg/dL — ABNORMAL LOW (ref >39.00)
LDL Cholesterol: 120 mg/dL — ABNORMAL HIGH (ref 0–99)
NonHDL: 133.68
Total CHOL/HDL Ratio: 5
Triglycerides: 69 mg/dL (ref 0.0–149.0)
VLDL: 13.8 mg/dL (ref 0.0–40.0)

## 2021-12-05 LAB — HEPATIC FUNCTION PANEL
ALT: 43 U/L (ref 0–53)
AST: 28 U/L (ref 0–37)
Albumin: 4.2 g/dL (ref 3.5–5.2)
Alkaline Phosphatase: 49 U/L (ref 39–117)
Bilirubin, Direct: 0.1 mg/dL (ref 0.0–0.3)
Total Bilirubin: 0.6 mg/dL (ref 0.2–1.2)
Total Protein: 6.5 g/dL (ref 6.0–8.3)

## 2021-12-05 LAB — BASIC METABOLIC PANEL
BUN: 15 mg/dL (ref 6–23)
CO2: 23 mEq/L (ref 19–32)
Calcium: 8.9 mg/dL (ref 8.4–10.5)
Chloride: 108 mEq/L (ref 96–112)
Creatinine, Ser: 1.01 mg/dL (ref 0.40–1.50)
GFR: 84.4 mL/min (ref >60.00)
Glucose, Bld: 95 mg/dL (ref 70–99)
Potassium: 3.7 mEq/L (ref 3.5–5.1)
Sodium: 141 mEq/L (ref 135–145)

## 2021-12-05 LAB — TSH: TSH: 1.86 u[IU]/mL (ref 0.35–5.50)

## 2021-12-08 LAB — HEPATITIS C ANTIBODY
Hepatitis C Ab: NONREACTIVE
SIGNAL TO CUT-OFF: <0.02 (ref <1.00)

## 2021-12-08 LAB — HIV ANTIBODY (ROUTINE TESTING W REFLEX): HIV 1&2 Ab, 4th Generation: NONREACTIVE

## 2021-12-16 ENCOUNTER — Ambulatory Visit (INDEPENDENT_AMBULATORY_CARE_PROVIDER_SITE_OTHER): Payer: BC Managed Care – PPO | Admitting: Internal Medicine

## 2021-12-16 ENCOUNTER — Encounter: Payer: Self-pay | Admitting: Internal Medicine

## 2021-12-16 ENCOUNTER — Other Ambulatory Visit: Payer: Self-pay

## 2021-12-16 VITALS — BP 132/82 | HR 64 | Temp 98.2°F | Resp 16 | Ht 75.0 in | Wt 258.0 lb

## 2021-12-16 DIAGNOSIS — Z0000 Encounter for general adult medical examination without abnormal findings: Secondary | ICD-10-CM

## 2021-12-16 DIAGNOSIS — Z1211 Encounter for screening for malignant neoplasm of colon: Secondary | ICD-10-CM

## 2021-12-16 DIAGNOSIS — C61 Malignant neoplasm of prostate: Secondary | ICD-10-CM

## 2021-12-16 NOTE — Progress Notes (Signed)
? ?Subjective:  ?Patient ID: Dakota Walker, male    DOB: [birth year]  Age: 56 y.o. MRN: [id number] ? ?CC: Annual Exam ? ?This visit occurred during the SARS-CoV-2 public health emergency.  Safety protocols were in place, including screening questions prior to the visit, additional usage of staff PPE, and extensive cleaning of exam room while observing appropriate contact time as indicated for disinfecting solutions.   ? ?HPI ?Dakota Walker presents for a CPX. ? ?He complains of weight gain - he is active and denies DOE, CP, diaphoresis, palpitations, or edema. ? ?Outpatient Medications Prior to Visit  ?Medication Sig Dispense Refill  ? finasteride (PROSCAR) 5 MG tablet Take 5 mg by mouth daily.    ? meloxicam (MOBIC) 15 MG tablet Take 1 tablet (15 mg total) by mouth daily. 30 tablet 3  ? silodosin (RAPAFLO) 8 MG CAPS capsule Take 8 mg by mouth daily with breakfast.    ? albuterol (VENTOLIN HFA) 108 (90 Base) MCG/ACT inhaler Inhale 2 puffs into the lungs every 6 (six) hours as needed for wheezing or shortness of breath. 8 g 1  ? predniSONE (DELTASONE) 10 MG tablet 3 tabs by mouth per day for 3 days,2tabs per day for 3 days,1tab per day for 3 days 18 tablet 0  ? ?No facility-administered medications prior to visit.  ? ? ?ROS ?Review of Systems  ?Constitutional:  Positive for unexpected weight change (wt gain). Negative for chills, diaphoresis and fatigue.  ?HENT: Negative.    ?Eyes: Negative.   ?Respiratory:  Negative for cough, shortness of breath and wheezing.   ?Cardiovascular:  Negative for chest pain, palpitations and leg swelling.  ?Endocrine: Negative.   ?Genitourinary: Negative.  Negative for difficulty urinating.  ?Neurological: Negative.  Negative for dizziness, weakness, light-headedness and numbness.  ?Hematological:  Negative for adenopathy. Does not bruise/bleed easily.  ?Psychiatric/Behavioral: Negative.    ? ?Objective:  ?BP 132/82 (BP Location: Left Arm, Patient Position: Sitting, Cuff  Size: Large)   Pulse 64   Temp 98.2 ?F (36.8 ?C) (Oral)   Resp 16   Ht '6\' 3"'$  (1.905 m)   Wt 258 lb (117 kg)   SpO2 95%   BMI 32.25 kg/m?  ? ?BP Readings from Last 3 Encounters:  ?12/16/21 132/82  ?09/24/21 138/80  ?10/27/18 120/75  ? ? ?Wt Readings from Last 3 Encounters:  ?12/16/21 258 lb (117 kg)  ?09/24/21 256 lb (116.1 kg)  ?10/27/18 230 lb (104.3 kg)  ? ? ?Physical Exam ?Vitals reviewed.  ?HENT:  ?   Nose: Nose normal.  ?   Mouth/Throat:  ?   Mouth: Mucous membranes are moist.  ?Eyes:  ?   General: No scleral icterus. ?   Conjunctiva/sclera: Conjunctivae normal.  ?Cardiovascular:  ?   Rate and Rhythm: Normal rate and regular rhythm.  ?   Heart sounds: No murmur heard. ?  No gallop.  ?Pulmonary:  ?   Effort: Pulmonary effort is normal. No respiratory distress.  ?   Breath sounds: No stridor. No wheezing, rhonchi or rales.  ?Chest:  ?   Chest wall: No tenderness.  ?Abdominal:  ?   General: Abdomen is flat.  ?   Palpations: There is no mass.  ?   Tenderness: There is no abdominal tenderness. There is no guarding.  ?   Hernia: No hernia is present.  ?Musculoskeletal:     ?   General: Normal range of motion.  ?   Cervical back: Neck supple.  ?   Right lower leg:  No edema.  ?   Left lower leg: No edema.  ?Lymphadenopathy:  ?   Cervical: No cervical adenopathy.  ?Skin: ?   General: Skin is warm and dry.  ?Neurological:  ?   General: No focal deficit present.  ?   Mental Status: He is alert. Mental status is at baseline.  ?Psychiatric:     ?   Mood and Affect: Mood normal.     ?   Behavior: Behavior normal.  ? ? ?Lab Results  ?Component Value Date  ? WBC 4.2 12/05/2021  ? HGB 14.8 12/05/2021  ? HCT 42.6 12/05/2021  ? PLT 167.0 12/05/2021  ? GLUCOSE 95 12/05/2021  ? CHOL 170 12/05/2021  ? TRIG 69.0 12/05/2021  ? HDL 36.70 (L) 12/05/2021  ? LDLDIRECT 177.1 11/27/2011  ? LDLCALC 120 (H) 12/05/2021  ? ALT 43 12/05/2021  ? AST 28 12/05/2021  ? NA 141 12/05/2021  ? K 3.7 12/05/2021  ? CL 108 12/05/2021  ? CREATININE  1.01 12/05/2021  ? BUN 15 12/05/2021  ? CO2 23 12/05/2021  ? TSH 1.86 12/05/2021  ? PSA 2.0 11/14/2021  ? ? ?MR PROSTATE W WO CONTRAST ? ?Result Date: 12/01/2021 ?CLINICAL DATA:  56 year old male with elevated PSA equal 4.29. prostate biopsy collected 11/01/2020. Biopsy revealed Gleason 3+3=6 adenocarcinoma at the RIGHT apex EXAM: MR PROSTATE WITHOUT AND WITH CONTRAST TECHNIQUE: Multiplanar multisequence MRI images were obtained of the pelvis centered about the prostate. Pre and post contrast images were obtained. CONTRAST:  39m MULTIHANCE GADOBENATE DIMEGLUMINE 529 MG/ML IV SOLN COMPARISON:  None. FINDINGS: Prostate: No foci of restricted diffusion in the peripheral zone. No focal lesion identified on T2 weighted imaging. Normal high signal intensity within the peripheral zone on T2 weighted imaging No suspicious imaging pattern on postcontrast T1 imaging Transitional zone is enlarged by capsulated nodules with out suspicious imaging characteristics on T2 weighted imaging (series 8). Volume: 5.1 x  5.4 x 3.8 cm (volume = 55 cm^3) Transcapsular spread:  Absent Seminal vesicle involvement: Absent Neurovascular bundle involvement: Absent Pelvic adenopathy: Absent Bone metastasis: Absent Other findings: None IMPRESSION: 1. No high-grade carcinoma identified within peripheral zone. 2. Enlarged nodular transitional zone most consistent with benign prostate hypertrophy. PI-RADS: 2 Electronically Signed   By: SSuzy BouchardM.D.   On: 12/01/2021 17:00  ? ? ?Assessment & Plan:  ? ?[name] seen today for annual exam. ? ?Diagnoses and all orders for this visit: ? ?Routine general medical examination at a health care facility- Exam completed, labs reviewed;statin therapy is not indicated, he refused a flu vaccine , cancer screenings reviewed and updated patient education material was given. ? ?Screen for colon cancer ?-     Cologuard ? ?[place] ? ? ?I have discontinued Dakota Walker's predniSONE and  albuterol. I am also having him maintain his silodosin, finasteride, and meloxicam. ? ?No orders of the defined types were placed in this encounter. ? ? ? ?Follow-up: Return in about 1 year (around 12/17/2022). ? ?TScarlette Calico MD ?

## 2021-12-16 NOTE — Patient Instructions (Signed)

## 2021-12-17 DIAGNOSIS — C61 Malignant neoplasm of prostate: Secondary | ICD-10-CM | POA: Insufficient documentation

## 2022-01-03 DIAGNOSIS — Z1211 Encounter for screening for malignant neoplasm of colon: Secondary | ICD-10-CM | POA: Diagnosis not present

## 2022-01-09 LAB — COLOGUARD: COLOGUARD: NEGATIVE

## 2022-05-18 DIAGNOSIS — L814 Other melanin hyperpigmentation: Secondary | ICD-10-CM | POA: Diagnosis not present

## 2022-05-18 DIAGNOSIS — D2261 Melanocytic nevi of right upper limb, including shoulder: Secondary | ICD-10-CM | POA: Diagnosis not present

## 2022-05-18 DIAGNOSIS — Z85828 Personal history of other malignant neoplasm of skin: Secondary | ICD-10-CM | POA: Diagnosis not present

## 2022-05-18 DIAGNOSIS — L821 Other seborrheic keratosis: Secondary | ICD-10-CM | POA: Diagnosis not present

## 2022-07-01 DIAGNOSIS — R351 Nocturia: Secondary | ICD-10-CM | POA: Diagnosis not present

## 2022-07-01 DIAGNOSIS — N401 Enlarged prostate with lower urinary tract symptoms: Secondary | ICD-10-CM | POA: Diagnosis not present

## 2022-07-03 ENCOUNTER — Ambulatory Visit (INDEPENDENT_AMBULATORY_CARE_PROVIDER_SITE_OTHER): Payer: BC Managed Care – PPO | Admitting: Nurse Practitioner

## 2022-07-03 VITALS — BP 116/74 | Temp 97.8°F | Ht 75.0 in | Wt 261.0 lb

## 2022-07-03 DIAGNOSIS — R051 Acute cough: Secondary | ICD-10-CM | POA: Diagnosis not present

## 2022-07-03 LAB — POCT INFLUENZA A/B: Influenza A, POC: NEGATIVE

## 2022-07-03 LAB — POCT RAPID STREP A (OFFICE): Rapid Strep A Screen: NEGATIVE

## 2022-07-03 LAB — POC COVID19 BINAXNOW: SARS Coronavirus 2 Ag: NEGATIVE

## 2022-07-03 MED ORDER — ALBUTEROL SULFATE HFA 108 (90 BASE) MCG/ACT IN AERS: 2.0000 | INHALATION_SPRAY | Freq: Four times a day (QID) | RESPIRATORY_TRACT | 2 refills | Status: DC | PRN

## 2022-07-03 MED ORDER — HYDROCODONE BIT-HOMATROP MBR 5-1.5 MG/5ML PO SOLN: 5.0000 mL | Freq: Two times a day (BID) | ORAL | 0 refills | Status: DC | PRN

## 2022-07-03 MED ORDER — PREDNISONE 20 MG PO TABS: 40.0000 mg | ORAL_TABLET | Freq: Every day | ORAL | 0 refills | Status: DC

## 2022-07-03 MED ORDER — AZITHROMYCIN 250 MG PO TABS
ORAL_TABLET | ORAL | 0 refills | Status: AC
Start: 2022-07-03 — End: 2022-07-08

## 2022-07-03 NOTE — Progress Notes (Signed)
   Established Patient Office Visit  Subjective   Patient ID: Dakota Walker, male    DOB: 01/15/1966  Age: 56 y.o. MRN: [id number]  Chief Complaint  Patient presents with   Cough    Acute, onset 4 days ago. Sore throat, cough. No fever, SOB. Took at home covid test which was negative. Had similar episode 1 year ago that lasted for approximately 1 month before he sought medical care and received treatment.     Review of Systems  Constitutional:  Negative for chills.  HENT:  Positive for sore throat.   Respiratory:  Positive for cough and sputum production (scant). Negative for shortness of breath and wheezing.   Cardiovascular:  Negative for chest pain.  Musculoskeletal:  Negative for myalgias.  Neurological:  Negative for headaches.      Objective:     BP 116/74   Temp 97.8 F (36.6 C) (Oral)   Ht '6\' 3"'$  (1.905 m)   Wt 261 lb (118.4 kg)   SpO2 96%   BMI 32.62 kg/m    Physical Exam Vitals reviewed.  Constitutional:      Appearance: Normal appearance.  HENT:     Head: Normocephalic and atraumatic.  Cardiovascular:     Rate and Rhythm: Normal rate and regular rhythm.  Pulmonary:     Effort: Pulmonary effort is normal.     Breath sounds: Decreased breath sounds present.  Musculoskeletal:     Cervical back: Neck supple.  Skin:    General: Skin is warm and dry.  Neurological:     Mental Status: He is alert and oriented to person, place, and time.  Psychiatric:        Mood and Affect: Mood normal.        Behavior: Behavior normal.        Thought Content: Thought content normal.        Judgment: Judgment normal.      Results for orders placed or performed in visit on 07/03/22  POCT Influenza A/B  Result Value Ref Range   Influenza A, POC Negative Negative   Influenza B, POC    POCT rapid strep A  Result Value Ref Range   Rapid Strep A Screen Negative Negative  POC COVID-19  Result Value Ref Range   SARS Coronavirus 2 Ag Negative Negative       The 10-year ASCVD risk score (Arnett DK, et al., 2019) is: 4.7%    Assessment & Plan:   Problem List Items Addressed This Visit       Other   Acute cough - Primary    Acute, symptoms fairly mild.  Point-of-care strep, COVID, flu test negative.  Recommend treating symptomatically with rest, hydration, Tylenol as needed for pain/fever, prednisone x5 days, Hycodan cough syrup as needed for cough.  Patient educated to take antibiotic if symptoms persist past 7 to 14 days, patient reports understanding.  Patient provided education regarding avoidance of NSAIDs while taking prednisone as well.  Patient encouraged to follow-up if symptoms persist or worsen.      Relevant Medications   HYDROcodone bit-homatropine (HYCODAN) 5-1.5 MG/5ML syrup   albuterol (VENTOLIN HFA) 108 (90 Base) MCG/ACT inhaler   azithromycin (ZITHROMAX) 250 MG tablet   predniSONE (DELTASONE) 20 MG tablet   Other Relevant Orders   POCT Influenza A/B (Completed)   POCT rapid strep A (Completed)   POC COVID-19 (Completed)    No follow-ups on file.    Ailene Ards, NP

## 2022-07-03 NOTE — Assessment & Plan Note (Signed)
Acute, symptoms fairly mild.  Point-of-care strep, COVID, flu test negative.  Recommend treating symptomatically with rest, hydration, Tylenol as needed for pain/fever, prednisone x5 days, Hycodan cough syrup as needed for cough.  Patient educated to take antibiotic if symptoms persist past 7 to 14 days, patient reports understanding.  Patient provided education regarding avoidance of NSAIDs while taking prednisone as well.  Patient encouraged to follow-up if symptoms persist or worsen.

## 2022-07-03 NOTE — Patient Instructions (Signed)
Rest, hydrate regularly with water. Take tylenol as needed for sore throat, fever, headache. Take prednisone morning with food and avoid NSAIDs (ibuprofen, motrin, aleve, goody powder, aspirin, etc) while taking the medication.  Do not drive or operate heavy machinery while using the cough syrup. Only take the antibiotic (azithromycin) if your symptoms persist past 7-14 days.

## 2022-07-07 DIAGNOSIS — R351 Nocturia: Secondary | ICD-10-CM | POA: Diagnosis not present

## 2022-07-07 DIAGNOSIS — R3915 Urgency of urination: Secondary | ICD-10-CM | POA: Diagnosis not present

## 2022-07-07 DIAGNOSIS — N401 Enlarged prostate with lower urinary tract symptoms: Secondary | ICD-10-CM | POA: Diagnosis not present

## 2022-07-07 DIAGNOSIS — C61 Malignant neoplasm of prostate: Secondary | ICD-10-CM | POA: Diagnosis not present

## 2022-08-14 DIAGNOSIS — R3915 Urgency of urination: Secondary | ICD-10-CM | POA: Diagnosis not present

## 2022-08-14 DIAGNOSIS — N401 Enlarged prostate with lower urinary tract symptoms: Secondary | ICD-10-CM | POA: Diagnosis not present

## 2022-08-17 ENCOUNTER — Other Ambulatory Visit: Payer: Self-pay | Admitting: Urology

## 2022-10-14 ENCOUNTER — Encounter (HOSPITAL_BASED_OUTPATIENT_CLINIC_OR_DEPARTMENT_OTHER): Payer: Self-pay | Admitting: Urology

## 2022-10-14 NOTE — Progress Notes (Signed)
Spoke w/ via phone for pre-op interview--- pt Lab needs dos----  no             Lab results------ no COVID test -----patient states asymptomatic no test needed Arrive at ------- 0530 on 10-16-2022 NPO after MN NO Solid Food.  Clear liquids from MN until--- 0430 Med rec completed Medications to take morning of surgery ----- proscar, rapaflo Diabetic medication ----- n/a Patient instructed no nail polish to be worn day of surgery Patient instructed to bring photo id and insurance card day of surgery Patient aware to have Driver (ride ) / caregiver    for 24 hours after surgery -- sig other, sherry Patient Special Instructions ----- n/a Pre-Op special Istructions ----- n/a Patient verbalized understanding of instructions that were given at this phone interview. Patient denies shortness of breath, chest pain, fever, cough at this phone interview.

## 2022-10-15 NOTE — Anesthesia Preprocedure Evaluation (Addendum)
Anesthesia Evaluation  Patient identified by MRN, date of birth, ID band Patient awake    Reviewed: Allergy & Precautions, NPO status , Patient's Chart, lab work & pertinent test results  History of Anesthesia Complications Negative for: history of anesthetic complications  Airway Mallampati: II  TM Distance: >3 FB Neck ROM: Full    Dental  (+) Dental Advisory Given, Teeth Intact   Pulmonary neg pulmonary ROS   Pulmonary exam normal        Cardiovascular negative cardio ROS Normal cardiovascular exam     Neuro/Psych negative neurological ROS  negative psych ROS   GI/Hepatic negative GI ROS, Neg liver ROS,,,  Endo/Other   Obesity   Renal/GU negative Renal ROS    Prostate cancer     Musculoskeletal negative musculoskeletal ROS (+)    Abdominal   Peds  Hematology negative hematology ROS (+)   Anesthesia Other Findings   Reproductive/Obstetrics                             Anesthesia Physical Anesthesia Plan  ASA: 2  Anesthesia Plan: General   Post-op Pain Management: Tylenol PO (pre-op)* and Celebrex PO (pre-op)*   Induction: Intravenous  PONV Risk Score and Plan: 2 and Treatment may vary due to age or medical condition, Ondansetron, Dexamethasone and Midazolam  Airway Management Planned: LMA  Additional Equipment: None  Intra-op Plan:   Post-operative Plan: Extubation in OR  Informed Consent: I have reviewed the patients History and Physical, chart, labs and discussed the procedure including the risks, benefits and alternatives for the proposed anesthesia with the patient or authorized representative who has indicated his/her understanding and acceptance.     Dental advisory given  Plan Discussed with: CRNA and Anesthesiologist  Anesthesia Plan Comments:        Anesthesia Quick Evaluation

## 2022-10-16 ENCOUNTER — Encounter (HOSPITAL_BASED_OUTPATIENT_CLINIC_OR_DEPARTMENT_OTHER)
Admission: RE | Disposition: A | Discharge status: Home / Self Care | Payer: Self-pay | Source: Home / Self Care | Attending: Urology

## 2022-10-16 ENCOUNTER — Ambulatory Visit (HOSPITAL_BASED_OUTPATIENT_CLINIC_OR_DEPARTMENT_OTHER)
Admission: RE | Admit: 2022-10-16 | Discharge: 2022-10-16 | Disposition: A | Discharge status: Home / Self Care | Payer: BC Managed Care – PPO | Attending: Urology | Admitting: Urology

## 2022-10-16 ENCOUNTER — Ambulatory Visit (HOSPITAL_BASED_OUTPATIENT_CLINIC_OR_DEPARTMENT_OTHER): Payer: BC Managed Care – PPO | Admitting: Anesthesiology

## 2022-10-16 ENCOUNTER — Encounter (HOSPITAL_BASED_OUTPATIENT_CLINIC_OR_DEPARTMENT_OTHER): Payer: Self-pay | Admitting: Urology

## 2022-10-16 ENCOUNTER — Other Ambulatory Visit: Payer: Self-pay

## 2022-10-16 DIAGNOSIS — R3912 Poor urinary stream: Secondary | ICD-10-CM | POA: Insufficient documentation

## 2022-10-16 DIAGNOSIS — Z01818 Encounter for other preprocedural examination: Secondary | ICD-10-CM

## 2022-10-16 DIAGNOSIS — N138 Other obstructive and reflux uropathy: Secondary | ICD-10-CM

## 2022-10-16 DIAGNOSIS — Z6832 Body mass index (BMI) 32.0-32.9, adult: Secondary | ICD-10-CM | POA: Insufficient documentation

## 2022-10-16 DIAGNOSIS — N401 Enlarged prostate with lower urinary tract symptoms: Secondary | ICD-10-CM | POA: Diagnosis not present

## 2022-10-16 DIAGNOSIS — E669 Obesity, unspecified: Secondary | ICD-10-CM | POA: Insufficient documentation

## 2022-10-16 HISTORY — DX: Bladder-neck obstruction: N32.0

## 2022-10-16 HISTORY — DX: Allergic rhinitis, unspecified: J30.9

## 2022-10-16 HISTORY — PX: THULIUM LASER TURP (TRANSURETHRAL RESECTION OF PROSTATE): SHX6744

## 2022-10-16 HISTORY — DX: Benign prostatic hyperplasia with lower urinary tract symptoms: N40.1

## 2022-10-16 HISTORY — DX: Asymptomatic varicose veins of bilateral lower extremities: I83.93

## 2022-10-16 HISTORY — DX: Presence of spectacles and contact lenses: Z97.3

## 2022-10-16 SURGERY — THULIUM LASER TURP (TRANSURETHRAL RESECTION OF PROSTATE)
Anesthesia: General | Site: Prostate

## 2022-10-16 MED ORDER — FENTANYL CITRATE (PF) 100 MCG/2ML IJ SOLN
INTRAMUSCULAR | Status: AC
  Filled 2022-10-16: qty 2

## 2022-10-16 MED ORDER — EPHEDRINE 5 MG/ML INJ
INTRAVENOUS | Status: AC
  Filled 2022-10-16: qty 5

## 2022-10-16 MED ORDER — ONDANSETRON HCL 4 MG/2ML IJ SOLN
INTRAMUSCULAR | Status: DC | PRN
  Administered 2022-10-16: 4 mg via INTRAVENOUS

## 2022-10-16 MED ORDER — LACTATED RINGERS IV SOLN: INTRAVENOUS | Status: DC

## 2022-10-16 MED ORDER — PROPOFOL 500 MG/50ML IV EMUL
INTRAVENOUS | Status: AC
  Filled 2022-10-16: qty 50

## 2022-10-16 MED ORDER — LIDOCAINE HCL (PF) 2 % IJ SOLN
INTRAMUSCULAR | Status: AC
  Filled 2022-10-16: qty 5

## 2022-10-16 MED ORDER — DEXAMETHASONE SODIUM PHOSPHATE 10 MG/ML IJ SOLN
INTRAMUSCULAR | Status: AC
  Filled 2022-10-16: qty 1

## 2022-10-16 MED ORDER — PROMETHAZINE HCL 25 MG/ML IJ SOLN: 6.2500 mg | INTRAMUSCULAR | Status: DC | PRN

## 2022-10-16 MED ORDER — LIDOCAINE 2% (20 MG/ML) 5 ML SYRINGE
INTRAMUSCULAR | Status: DC | PRN
  Administered 2022-10-16: 60 mg via INTRAVENOUS

## 2022-10-16 MED ORDER — SODIUM CHLORIDE 0.9 % IR SOLN
Status: DC | PRN
  Administered 2022-10-16: 6000 mL
  Administered 2022-10-16 (×2): 3000 mL

## 2022-10-16 MED ORDER — PHENYLEPHRINE 80 MCG/ML (10ML) SYRINGE FOR IV PUSH (FOR BLOOD PRESSURE SUPPORT)
PREFILLED_SYRINGE | INTRAVENOUS | Status: AC
  Filled 2022-10-16: qty 10

## 2022-10-16 MED ORDER — STERILE WATER FOR IRRIGATION IR SOLN: Status: DC | PRN

## 2022-10-16 MED ORDER — DEXAMETHASONE SODIUM PHOSPHATE 10 MG/ML IJ SOLN
INTRAMUSCULAR | Status: DC | PRN
  Administered 2022-10-16 (×2): 10 mg via INTRAVENOUS

## 2022-10-16 MED ORDER — MIDAZOLAM HCL 2 MG/2ML IJ SOLN
INTRAMUSCULAR | Status: DC | PRN
  Administered 2022-10-16: 2 mg via INTRAVENOUS

## 2022-10-16 MED ORDER — MIDAZOLAM HCL 2 MG/2ML IJ SOLN
INTRAMUSCULAR | Status: AC
  Filled 2022-10-16: qty 2

## 2022-10-16 MED ORDER — GLYCOPYRROLATE PF 0.2 MG/ML IJ SOSY
PREFILLED_SYRINGE | INTRAMUSCULAR | Status: AC
  Filled 2022-10-16: qty 1

## 2022-10-16 MED ORDER — FENTANYL CITRATE (PF) 100 MCG/2ML IJ SOLN: 25.0000 ug | INTRAMUSCULAR | Status: DC | PRN

## 2022-10-16 MED ORDER — STERILE WATER FOR IRRIGATION IR SOLN
Status: DC | PRN
  Administered 2022-10-16: 500 mL

## 2022-10-16 MED ORDER — EPHEDRINE SULFATE-NACL 50-0.9 MG/10ML-% IV SOSY
PREFILLED_SYRINGE | INTRAVENOUS | Status: DC | PRN
  Administered 2022-10-16 (×3): 10 mg via INTRAVENOUS

## 2022-10-16 MED ORDER — OXYCODONE HCL 5 MG/5ML PO SOLN: 5.0000 mg | Freq: Once | ORAL | Status: DC | PRN

## 2022-10-16 MED ORDER — GLYCOPYRROLATE PF 0.2 MG/ML IJ SOSY
PREFILLED_SYRINGE | INTRAMUSCULAR | Status: DC | PRN
  Administered 2022-10-16: .2 mg via INTRAVENOUS

## 2022-10-16 MED ORDER — CEFAZOLIN SODIUM-DEXTROSE 2-4 GM/100ML-% IV SOLN
2.0000 g | INTRAVENOUS | Status: AC
  Administered 2022-10-16: 2 g via INTRAVENOUS

## 2022-10-16 MED ORDER — CELECOXIB 200 MG PO CAPS
ORAL_CAPSULE | ORAL | Status: AC
  Filled 2022-10-16: qty 1

## 2022-10-16 MED ORDER — PHENYLEPHRINE 80 MCG/ML (10ML) SYRINGE FOR IV PUSH (FOR BLOOD PRESSURE SUPPORT)
PREFILLED_SYRINGE | INTRAVENOUS | Status: DC | PRN
  Administered 2022-10-16: 160 ug via INTRAVENOUS

## 2022-10-16 MED ORDER — ACETAMINOPHEN 500 MG PO TABS
ORAL_TABLET | ORAL | Status: AC
  Filled 2022-10-16: qty 2

## 2022-10-16 MED ORDER — CEFAZOLIN SODIUM-DEXTROSE 2-4 GM/100ML-% IV SOLN
INTRAVENOUS | Status: AC
  Filled 2022-10-16: qty 100

## 2022-10-16 MED ORDER — OXYCODONE HCL 5 MG PO TABS: 5.0000 mg | ORAL_TABLET | Freq: Once | ORAL | Status: DC | PRN

## 2022-10-16 MED ORDER — ACETAMINOPHEN 500 MG PO TABS
1000.0000 mg | ORAL_TABLET | Freq: Once | ORAL | Status: AC
  Administered 2022-10-16: 1000 mg via ORAL

## 2022-10-16 MED ORDER — CEPHALEXIN 500 MG PO CAPS: 500.0000 mg | ORAL_CAPSULE | Freq: Every evening | ORAL | 0 refills | Status: AC

## 2022-10-16 MED ORDER — PROPOFOL 10 MG/ML IV BOLUS
INTRAVENOUS | Status: DC | PRN
  Administered 2022-10-16: 200 mg via INTRAVENOUS
  Administered 2022-10-16: 20 mg via INTRAVENOUS

## 2022-10-16 MED ORDER — CELECOXIB 200 MG PO CAPS
200.0000 mg | ORAL_CAPSULE | Freq: Once | ORAL | Status: AC
  Administered 2022-10-16: 200 mg via ORAL

## 2022-10-16 MED ORDER — FENTANYL CITRATE (PF) 100 MCG/2ML IJ SOLN
INTRAMUSCULAR | Status: DC | PRN
  Administered 2022-10-16: 100 ug via INTRAVENOUS
  Administered 2022-10-16: 50 ug via INTRAVENOUS

## 2022-10-16 MED ORDER — ONDANSETRON HCL 4 MG/2ML IJ SOLN
INTRAMUSCULAR | Status: AC
  Filled 2022-10-16: qty 2

## 2022-10-16 SURGICAL SUPPLY — 33 items
BAG DRAIN URO-CYSTO SKYTR STRL (DRAIN) ×1 IMPLANT
BAG DRN RND TRDRP ANRFLXCHMBR (UROLOGICAL SUPPLIES) ×1
BAG DRN UROCATH (DRAIN) ×1
BAG URINE DRAIN 2000ML AR STRL (UROLOGICAL SUPPLIES) ×1 IMPLANT
CATH COUDE FOLEY 2W 5CC 18FR (CATHETERS) IMPLANT
CATH FOLEY 2WAY SLVR  5CC 18FR (CATHETERS)
CATH FOLEY 2WAY SLVR  5CC 20FR (CATHETERS) ×1
CATH FOLEY 2WAY SLVR 5CC 18FR (CATHETERS) IMPLANT
CATH FOLEY 2WAY SLVR 5CC 20FR (CATHETERS) IMPLANT
CATH FOLEY 3WAY 30CC 22FR (CATHETERS) IMPLANT
CLOTH BEACON ORANGE TIMEOUT ST (SAFETY) ×1 IMPLANT
ELECT BIVAP BIPO 22/24 DONUT (ELECTROSURGICAL)
ELECTRD BIVAP BIPO 22/24 DONUT (ELECTROSURGICAL) IMPLANT
GLOVE BIO SURGEON STRL SZ7.5 (GLOVE) ×1 IMPLANT
GLOVE BIO SURGEON STRL SZ8 (GLOVE) IMPLANT
GOWN STRL REUS W/TWL LRG LVL3 (GOWN DISPOSABLE) ×1 IMPLANT
GOWN STRL REUS W/TWL XL LVL3 (GOWN DISPOSABLE) IMPLANT
HOLDER FOLEY CATH W/STRAP (MISCELLANEOUS) IMPLANT
IV NS 1000ML (IV SOLUTION)
IV NS 1000ML BAXH (IV SOLUTION) ×1 IMPLANT
IV NS IRRIG 3000ML ARTHROMATIC (IV SOLUTION) ×1 IMPLANT
KIT TURNOVER CYSTO (KITS) ×1 IMPLANT
LASER REVOLIX HI ENERGY 1000 (Laser) ×1 IMPLANT
LASER REVOLIX PROCEDURE (MISCELLANEOUS) ×1 IMPLANT
LOOP CUT BIPOLAR 24F LRG (ELECTROSURGICAL) IMPLANT
MANIFOLD NEPTUNE II (INSTRUMENTS) IMPLANT
PACK CYSTO (CUSTOM PROCEDURE TRAY) ×1 IMPLANT
Quanta System OpticalFiber 1000 IMPLANT
SYR 30ML LL (SYRINGE) IMPLANT
SYR TOOMEY IRRIG 70ML (MISCELLANEOUS) ×1
SYRINGE TOOMEY IRRIG 70ML (MISCELLANEOUS) IMPLANT
TUBE CONNECTING 12X1/4 (SUCTIONS) IMPLANT
TUBING UROLOGY SET (TUBING) IMPLANT

## 2022-10-16 NOTE — Op Note (Signed)
Preoperative diagnosis: BPH with lower urinary tract symptoms Postoperative diagnosis: Same  Procedure: Thulium laser vaporization of the prostate  Surgeon: Junious Silk  Anesthesia: General  Indication for procedure: Dakota Walker is a 57 year old male maximal medical therapy for BPH.  He like to get off these medicines and have some symptom improvement as well.  He had a weak stream.  Findings: On exam under anesthesia the penis was circumcised without mass or lesion.  Glans and meatus appeared normal.  Scrotum appeared normal.  On DRE prostate was about 40 g and smooth without hard area or nodule.  On cystoscopy there was trilobar hypertrophy with a high bladder neck.  Bladder was mildly trabeculated with good capacity.  No mucosal lesions.  No stone or foreign body.  Trigone and ureteral orifice ease were in their normal orthotopic position.  Description of procedure: After consent was obtained patient brought to the operating room.  After adequate anesthesia he was placed in lithotomy position and prepped and draped in the usual sterile fashion.  Timeout was performed to confirm the patient and procedure.  Exam under anesthesia was performed.  Cystoscope passed per urethra with the continuous-flow laser sheath.  Bladder inspected.  800 m laser fiber passed.  I started at 5 and 7:00 after identifying the ureteral orifice ease and made incisions down through the prostate into the bladder neck to the verumontanum thus identifying the median and lateral lobes.  The median lobe was then vaporized.  I then made an incision above the left lateral lobe and above the right lateral lobe to the bladder neck to define the lateral lobes.  The left lateral lobe was vaporized from anterior to posterior bladder neck to verumontanum.  The right lateral lobe was vaporized.  The bladder was drained and then refilled and under low pressure some residual lateral lobe tissue was vaporized.  This created an excellent channel.   Excellent channel at low pressure and hemostasis excellent at low pressure.  Scope was removed and a 20 [place] two-way catheter was advanced without difficulty.  It was left to gravity drainage.  Urine was clear.  Irrigation was clear.  Left 17 cc in the balloon.  He was awakened and taken recovery room in stable condition.  Complications: None  Blood loss: Minimal  Specimen: None  Drains: 20 French Foley catheter  Disposition: Patient stable to PACU

## 2022-10-16 NOTE — Anesthesia Procedure Notes (Signed)
Procedure Name: LMA Insertion Date/Time: 10/16/2022 7:54 AM  Performed by: Mechele Claude, CRNAPre-anesthesia Checklist: Patient identified, Emergency Drugs available, Suction available and Patient being monitored Patient Re-evaluated:Patient Re-evaluated prior to induction Oxygen Delivery Method: Circle system utilized Preoxygenation: Pre-oxygenation with 100% oxygen Induction Type: IV induction Ventilation: Mask ventilation without difficulty LMA: LMA inserted LMA Size: 5.0 Number of attempts: 1 Airway Equipment and Method: Bite block Placement Confirmation: positive ETCO2 Tube secured with: Tape Dental Injury: Teeth and Oropharynx as per pre-operative assessment

## 2022-10-16 NOTE — Anesthesia Postprocedure Evaluation (Signed)
Anesthesia Post Note  Patient: Dakota Walker  Procedure(s) Performed: THULIUM LASER TURP (TRANSURETHRAL RESECTION OF PROSTATE) (Prostate)     Patient location during evaluation: PACU Anesthesia Type: General Level of consciousness: awake and alert Pain management: pain level controlled Vital Signs Assessment: post-procedure vital signs reviewed and stable Respiratory status: spontaneous breathing, nonlabored ventilation and respiratory function stable Cardiovascular status: stable and blood pressure returned to baseline Anesthetic complications: no   No notable events documented.  Last Vitals:  Vitals:   10/16/22 1010 10/16/22 1017  BP:    Pulse: 64 63  Resp: 20   Temp: (!) 36.4 C   SpO2: 92% 96%    Last Pain:  Vitals:   10/16/22 1010  TempSrc:   PainSc: [place]

## 2022-10-16 NOTE — H&P (Signed)
H&P  Chief Complaint: BPH, lower urinary tract symptoms  History of Present Illness: Dakota Walker is a 57 year old male with a history of BPH and lower urinary tract symptoms.  He is on maximal medical therapy with silodosin and finasteride.  Given his desire to get off medicines, symptom improvement and based on his prostate anatomy and size we elected to proceed with thulium laser vaporization of the prostate.  He presents for that today.  He is also on active surveillance for low risk prostate cancer with Dr. Jeffie Pollock.  He had a negative MRI February 2023 with a 55 g prostate and a PSA of 1.73 in September 2023.  He is well.  No cough, cold or congestion.  No dysuria or gross hematuria.  Past Medical History:  Diagnosis Date   Allergic rhinitis    Benign localized prostatic hyperplasia with lower urinary tract symptoms (LUTS)    urologist--- dr wrenn/ dr Louis Meckel   Bladder outflow obstruction    History of prostatitis 09/05/2011   Malignant neoplasm prostate ([facility]) 10/2020   urologist--- dr Jeffie Pollock;  dx 01/ 2022  Gleason 3+3;  active survillance   Varicose veins of both lower extremities    vascular --- diskson;   lov in epic 10-27-2018,  venous reflux   Wears glasses    Past Surgical History:  Procedure Laterality Date   APPENDECTOMY  1989   basal cell exicision     Scalp and eyebrow with local anesthesia; 2023, 2019   CATARACT EXTRACTION W/ INTRAOCULAR LENS IMPLANT Bilateral    right 2017;  left 2020    Home Medications:  Medications Prior to Admission  Medication Sig Dispense Refill Last Dose   acetaminophen (TYLENOL) 325 MG tablet Take 650 mg by mouth every 6 (six) hours as needed.   Past Week   finasteride (PROSCAR) 5 MG tablet Take 5 mg by mouth daily.   10/16/2022 at 0430   loratadine (CLARITIN) 10 MG tablet Take 10 mg by mouth daily as needed for allergies.   Past Month   Probiotic Product (PROBIOTIC DAILY) CAPS Take by mouth daily.   Past Month   silodosin (RAPAFLO) 8 MG CAPS  capsule Take 8 mg by mouth daily with breakfast.   10/16/2022 at 0430   albuterol (VENTOLIN HFA) 108 (90 Base) MCG/ACT inhaler Inhale 2 puffs into the lungs every 6 (six) hours as needed for wheezing or shortness of breath. (Patient not taking: Reported on 10/14/2022) 8 g 2 Not Taking   HYDROcodone bit-homatropine (HYCODAN) 5-1.5 MG/5ML syrup Take 5 mLs by mouth every 12 (twelve) hours as needed for cough. (Patient not taking: Reported on 10/14/2022) 120 mL 0 Not Taking   Allergies: No Known Allergies  Family History  Problem Relation Age of Onset   Cancer Other        Lung Cancer   Heart disease Other    Hypertension Other    Hypertension Father    Alcohol abuse Neg Hx    Hyperlipidemia Neg Hx    Stroke Neg Hx    Social History:  reports that he has never smoked. He has never used smokeless tobacco. He reports current alcohol use of about 10.0 standard drinks of alcohol per week. He reports that he does not use drugs.  ROS: A complete review of systems was performed.  All systems are negative except for pertinent findings as noted. Review of Systems  All other systems reviewed and are negative.    Physical Exam:  Vital signs in last 24 hours:  Temp:  [97.8 F (36.6 C)] 97.8 F (36.6 C) (01/12 0559) Pulse Rate:  [66] 66 (01/12 0559) Resp:  [17] 17 (01/12 0559) BP: (136)/(82) 136/82 (01/12 0559) SpO2:  [99 %] 99 % (01/12 0559) Weight:  [223.3 kg] 118.2 kg (01/12 0559) General:  Alert and oriented, No acute distress HEENT: Normocephalic, atraumatic Cardiovascular: Regular rate and rhythm Lungs: Regular rate and effort Abdomen: Soft, nontender, nondistended, no abdominal masses Back: No CVA tenderness Extremities: No edema Neurologic: Grossly intact  Laboratory Data:  No results found for this or any previous visit (from the past 24 hour(s)). No results found for this or any previous visit (from the past 240 hour(s)). Creatinine: No results for input(s): "CREATININE" in the  last 168 hours.  Impression/Assessment/plan:  I discussed with the patient the nature, potential benefits, risks and alternatives to thulium laser vaporization of the prostate possible TURP, including side effects of the proposed treatment, the likelihood of the patient achieving the goals of the procedure, and any potential problems that might occur during the procedure or recuperation. All questions answered. Patient elects to proceed.   [name] 10/16/2022, 7:43 AM

## 2022-10-16 NOTE — Transfer of Care (Signed)
Immediate Anesthesia Transfer of Care Note  Patient: Dakota Walker  Procedure(s) Performed: Procedure(s) (LRB): THULIUM LASER TURP (TRANSURETHRAL RESECTION OF PROSTATE) (N/A)  Patient Location: PACU  Anesthesia Type: General  Level of Consciousness: awake, alert  and oriented  Airway & Oxygen Therapy: Patient Spontanous Breathing and Patient connected to nasal cannula oxygen  Post-op Assessment: Report given to PACU RN and Post -op Vital signs reviewed and stable  Post vital signs: Reviewed and stable  Complications: No apparent anesthesia complications  Last Vitals:  Vitals Value Taken Time  BP 126/86 10/16/22 0908  Temp    Pulse 79 10/16/22 0915  Resp 10 10/16/22 0915  SpO2 92 % 10/16/22 0915  Vitals shown include unvalidated device data.  Last Pain:  Vitals:   10/16/22 0559  TempSrc: Oral  PainSc: 0-No pain      Patients Stated Pain Goal: 4 (36/06/77 0340)  Complications: No notable events documented.

## 2022-10-16 NOTE — Discharge Instructions (Addendum)
No acetaminophen/Tylenol until after 12:00pm today if needed for pain.   No ibuprofen, Advil, Aleve, Motrin, ketorolac, meloxicam, naproxen, or other NSAIDS until after 12:00pm today if needed for pain.     Post Anesthesia Home Care Instructions  Activity: Get plenty of rest for the remainder of the day. A responsible individual must stay with you for 24 hours following the procedure.  For the next 24 hours, DO NOT: -Drive a car -Paediatric nurse -Drink alcoholic beverages -Take any medication unless instructed by your physician -Make any legal decisions or sign important papers.  Meals: Start with liquid foods such as gelatin or soup. Progress to regular foods as tolerated. Avoid greasy, spicy, heavy foods. If nausea and/or vomiting occur, drink only clear liquids until the nausea and/or vomiting subsides. Call your physician if vomiting continues.  Special Instructions/Symptoms: Your throat may feel dry or sore from the anesthesia or the breathing tube placed in your throat during surgery. If this causes discomfort, gargle with warm salt water. The discomfort should disappear within 24 hours.

## 2022-10-19 ENCOUNTER — Encounter (HOSPITAL_BASED_OUTPATIENT_CLINIC_OR_DEPARTMENT_OTHER): Payer: Self-pay | Admitting: Urology

## 2022-11-17 DIAGNOSIS — N401 Enlarged prostate with lower urinary tract symptoms: Secondary | ICD-10-CM | POA: Diagnosis not present

## 2022-11-17 DIAGNOSIS — R3915 Urgency of urination: Secondary | ICD-10-CM | POA: Diagnosis not present

## 2022-11-17 DIAGNOSIS — R8271 Bacteriuria: Secondary | ICD-10-CM | POA: Diagnosis not present

## 2023-01-14 DIAGNOSIS — R351 Nocturia: Secondary | ICD-10-CM | POA: Diagnosis not present

## 2023-01-14 DIAGNOSIS — R3915 Urgency of urination: Secondary | ICD-10-CM | POA: Diagnosis not present

## 2023-03-12 ENCOUNTER — Telehealth: Payer: BC Managed Care – PPO | Admitting: Family Medicine

## 2023-03-12 DIAGNOSIS — U071 COVID-19: Secondary | ICD-10-CM | POA: Diagnosis not present

## 2023-03-12 MED ORDER — FLUTICASONE PROPIONATE 50 MCG/ACT NA SUSP
2.0000 | Freq: Every day | NASAL | 0 refills | Status: AC
Start: 2023-03-12 — End: ?

## 2023-03-12 MED ORDER — NIRMATRELVIR/RITONAVIR (PAXLOVID)TABLET: 3.0000 | ORAL_TABLET | Freq: Two times a day (BID) | ORAL | 0 refills | Status: AC

## 2023-03-12 MED ORDER — ALBUTEROL SULFATE HFA 108 (90 BASE) MCG/ACT IN AERS
1.0000 | INHALATION_SPRAY | Freq: Four times a day (QID) | RESPIRATORY_TRACT | 0 refills | Status: AC | PRN
Start: 2023-03-12 — End: ?

## 2023-03-12 MED ORDER — PROMETHAZINE-DM 6.25-15 MG/5ML PO SYRP
5.0000 mL | ORAL_SOLUTION | Freq: Four times a day (QID) | ORAL | 0 refills | Status: AC | PRN
Start: 2023-03-12 — End: ?

## 2023-03-12 NOTE — Progress Notes (Signed)
Virtual Visit Consent   Dakota Walker, you are scheduled for a virtual visit with a [facility] provider today. Just as with appointments in the office, your consent must be obtained to participate. Your consent will be active for this visit and any virtual visit you may have with one of our providers in the next 365 days. If you have a MyChart account, a copy of this consent can be sent to you electronically.  As this is a virtual visit, video technology does not allow for your provider to perform a traditional examination. This may limit your provider's ability to fully assess your condition. If your provider identifies any concerns that need to be evaluated in person or the need to arrange testing (such as labs, EKG, etc.), we will make arrangements to do so. Although advances in technology are sophisticated, we cannot ensure that it will always work on either your end or our end. If the connection with a video visit is poor, the visit may have to be switched to a telephone visit. With either a video or telephone visit, we are not always able to ensure that we have a secure connection.  By engaging in this virtual visit, you consent to the provision of healthcare and authorize for your insurance to be billed (if applicable) for the services provided during this visit. Depending on your insurance coverage, you may receive a charge related to this service.  I need to obtain your verbal consent now. Are you willing to proceed with your visit today? Dakota Walker has provided verbal consent on 03/12/2023 for a virtual visit (video or telephone). Dakota Finner, NP  Date: 03/12/2023 1:03 PM  Virtual Visit via Video Note   I, Dakota Walker, connected with  Dakota Walker  ([id number], July 01, 1966) on 03/12/23 at  1:00 PM EDT by a video-enabled telemedicine application and verified that I am speaking with the correct person using two identifiers.  Location: Patient: Virtual Visit  Location Patient: Home Provider: Virtual Visit Location Provider: Home Office   I discussed the limitations of evaluation and management by telemedicine and the availability of in person appointments. The patient expressed understanding and agreed to proceed.    History of Present Illness: Dakota Walker is a 57 y.o. who identifies as a male who was assigned male at birth, and is being seen today for covid +  Onset was Monday seemed like allergies- Tuesday into Wed noted Claritin D  for allergies not helping. Associated symptoms are congestion, headache, wheezing and coughing- hard to sleep, it is productive, shortness of breath when walking up stairs - mild  Modifying factors are Claritin D, mucinex DM, NSAIDS,   Denies shortness of breath, fevers, chills  Exposure to sick contacts- unknown- but recent travel by plane-  COVID test: + this morning Vaccines: yes, but unsure of last booster   Problems:  Patient Active Problem List   Diagnosis Date Noted   Prostate cancer ([facility]) 12/17/2021   Screen for colon cancer 12/16/2021   Acute cough 09/24/2021   Varicose veins of both lower extremities with complications 05/23/2018   Nasal polyp, posterior 01/15/2015   Pure hypercholesterolemia 12/03/2011   Allergic rhinitis 12/03/2011   Routine general medical examination at a health care facility 11/27/2011    Allergies: No Known Allergies Medications:  Current Outpatient Medications:    acetaminophen (TYLENOL) 325 MG tablet, Take 650 mg by mouth every 6 (six) hours as needed., Disp: , Rfl:    albuterol (VENTOLIN HFA) 108 (90  Base) MCG/ACT inhaler, Inhale 2 puffs into the lungs every 6 (six) hours as needed for wheezing or shortness of breath. (Patient not taking: Reported on 10/14/2022), Disp: 8 g, Rfl: 2   cephALEXin (KEFLEX) 500 MG capsule, Take 1 capsule (500 mg total) by mouth at bedtime., Disp: 7 capsule, Rfl: 0   finasteride (PROSCAR) 5 MG tablet, Take 5 mg by mouth daily., Disp:  , Rfl:    HYDROcodone bit-homatropine (HYCODAN) 5-1.5 MG/5ML syrup, Take 5 mLs by mouth every 12 (twelve) hours as needed for cough. (Patient not taking: Reported on 10/14/2022), Disp: 120 mL, Rfl: 0   loratadine (CLARITIN) 10 MG tablet, Take 10 mg by mouth daily as needed for allergies., Disp: , Rfl:    Probiotic Product (PROBIOTIC DAILY) CAPS, Take by mouth daily., Disp: , Rfl:    silodosin (RAPAFLO) 8 MG CAPS capsule, Take 8 mg by mouth daily with breakfast., Disp: , Rfl:   Observations/Objective: Patient is well-developed, well-nourished in no acute distress.  Resting comfortably  at home.  Head is normocephalic, atraumatic.  No labored breathing.  Speech is clear and coherent with logical content.  Patient is alert and oriented at baseline.  Cough and mild throat clearing noted Congestion tone noted  Assessment and Plan:  1. COVID-19  - nirmatrelvir/ritonavir (PAXLOVID) 20 x 150 MG & 10 x 100MG  TABS; Take 3 tablets by mouth 2 (two) times daily for 5 days. (Take nirmatrelvir 150 mg two tablets twice daily for 5 days and ritonavir 100 mg one tablet twice daily for 5 days) Patient GFR is 89  Dispense: 30 tablet; Refill: 0 - fluticasone (FLONASE) 50 MCG/ACT nasal spray; Place 2 sprays into both nostrils daily.  Dispense: 16 g; Refill: 0 - promethazine-dextromethorphan (PROMETHAZINE-DM) 6.25-15 MG/5ML syrup; Take 5 mLs by mouth 4 (four) times daily as needed for cough.  Dispense: 118 mL; Refill: 0 - albuterol (VENTOLIN HFA) 108 (90 Base) MCG/ACT inhaler; Inhale 1-2 puffs into the lungs every 6 (six) hours as needed for wheezing or shortness of breath.  Dispense: 8 g; Refill: 0  - Continue OTC symptomatic management of choice - Info for COVID sent on AVS as well - Take prescribed medications as directed - Push fluids - Rest as needed - Discussed return precautions and when to seek in-person evaluation, sent via AVS as well   Reviewed side effects, risks and benefits of medication.     Patient acknowledged agreement and understanding of the plan.   Past Medical, Surgical, Social History, Allergies, and Medications have been Reviewed.    Follow Up Instructions: I discussed the assessment and treatment plan with the patient. The patient was provided an opportunity to ask questions and all were answered. The patient agreed with the plan and demonstrated an understanding of the instructions.  A copy of instructions were sent to the patient via MyChart unless otherwise noted below.    The patient was advised to call back or seek an in-person evaluation if the symptoms worsen or if the condition fails to improve as anticipated.  Time:  I spent 10 minutes with the patient via telehealth technology discussing the above problems/concerns.    Dakota Finner, NP

## 2023-03-12 NOTE — Patient Instructions (Signed)
Margit Banda, thank you for joining Freddy Finner, NP for today's virtual visit.  While this provider is not your primary care provider (PCP), if your PCP is located in our provider database this encounter information will be shared with them immediately following your visit.   A [facility] MyChart account gives you access to today's visit and all your visits, tests, and labs performed at [facility] " click here if you don't have a [facility] MyChart account or go to [email or web address]  Consent: (Patient) Margit Banda provided verbal consent for this virtual visit at the beginning of the encounter.  Current Medications:  Current Outpatient Medications:    albuterol (VENTOLIN HFA) 108 (90 Base) MCG/ACT inhaler, Inhale 1-2 puffs into the lungs every 6 (six) hours as needed for wheezing or shortness of breath., Disp: 8 g, Rfl: 0   fluticasone (FLONASE) 50 MCG/ACT nasal spray, Place 2 sprays into both nostrils daily., Disp: 16 g, Rfl: 0   nirmatrelvir/ritonavir (PAXLOVID) 20 x 150 MG & 10 x 100MG  TABS, Take 3 tablets by mouth 2 (two) times daily for 5 days. (Take nirmatrelvir 150 mg two tablets twice daily for 5 days and ritonavir 100 mg one tablet twice daily for 5 days) Patient GFR is 89, Disp: 30 tablet, Rfl: 0   promethazine-dextromethorphan (PROMETHAZINE-DM) 6.25-15 MG/5ML syrup, Take 5 mLs by mouth 4 (four) times daily as needed for cough., Disp: 118 mL, Rfl: 0   acetaminophen (TYLENOL) 325 MG tablet, Take 650 mg by mouth every 6 (six) hours as needed., Disp: , Rfl:    cephALEXin (KEFLEX) 500 MG capsule, Take 1 capsule (500 mg total) by mouth at bedtime., Disp: 7 capsule, Rfl: 0   finasteride (PROSCAR) 5 MG tablet, Take 5 mg by mouth daily., Disp: , Rfl:    loratadine (CLARITIN) 10 MG tablet, Take 10 mg by mouth daily as needed for allergies., Disp: , Rfl:    Probiotic Product (PROBIOTIC DAILY) CAPS, Take by mouth daily., Disp: , Rfl:    silodosin  (RAPAFLO) 8 MG CAPS capsule, Take 8 mg by mouth daily with breakfast., Disp: , Rfl:    Medications ordered in this encounter:  Meds ordered this encounter  Medications   nirmatrelvir/ritonavir (PAXLOVID) 20 x 150 MG & 10 x 100MG  TABS    Sig: Take 3 tablets by mouth 2 (two) times daily for 5 days. (Take nirmatrelvir 150 mg two tablets twice daily for 5 days and ritonavir 100 mg one tablet twice daily for 5 days) Patient GFR is 89    Dispense:  30 tablet    Refill:  0    Order Specific Question:   Supervising Provider    Answer:   Merrilee Jansky [[id number]]   fluticasone (FLONASE) 50 MCG/ACT nasal spray    Sig: Place 2 sprays into both nostrils daily.    Dispense:  16 g    Refill:  0    Order Specific Question:   Supervising Provider    Answer:   Merrilee Jansky [id number]   promethazine-dextromethorphan (PROMETHAZINE-DM) 6.25-15 MG/5ML syrup    Sig: Take 5 mLs by mouth 4 (four) times daily as needed for cough.    Dispense:  118 mL    Refill:  0    Order Specific Question:   Supervising Provider    Answer:   Merrilee Jansky [[id number]]   albuterol (VENTOLIN HFA) 108 (90 Base) MCG/ACT inhaler    Sig: Inhale 1-2 puffs into the lungs every 6 (six) hours as  needed for wheezing or shortness of breath.    Dispense:  8 g    Refill:  0    Order Specific Question:   Supervising Provider    Answer:   Merrilee Jansky [id number]     *If you need refills on other medications prior to your next appointment, please contact your pharmacy*  Follow-Up: Call back or seek an in-person evaluation if the symptoms worsen or if the condition fails to improve as anticipated.  [facility] [phone]  Care Instructions:   - Continue OTC symptomatic management of choice  - Take prescribed medications as directed - Push fluids - Rest as needed  Follow up in person if not improving.  Isolation Instructions: You are to isolate at home until you have been fever free for at least  24 hours without a fever-reducing medication, and symptoms have been steadily improving for 24 hours. At that time,  you can end isolation but need to mask for an additional 5 days.   If you must be around other household members who do not have symptoms, you need to make sure that both you and the family members are masking consistently with a high-quality mask.  If you note any worsening of symptoms despite treatment, please seek an in-person evaluation ASAP. If you note any significant shortness of breath or any chest pain, please seek ER evaluation. Please do not delay care!   COVID-19: What to Do if You Are Sick If you test positive and are an older adult or someone who is at high risk of getting very sick from COVID-19, treatment may be available. Contact a healthcare provider right away after a positive test to determine if you are eligible, even if your symptoms are mild right now. You can also visit a Test to Treat location and, if eligible, receive a prescription from a provider. Don't delay: Treatment must be started within the first few days to be effective. If you have a fever, cough, or other symptoms, you might have COVID-19. Most people have mild illness and are able to recover at home. If you are sick: Keep track of your symptoms. If you have an emergency warning sign (including trouble breathing), call 911. Steps to help prevent the spread of COVID-19 if you are sick If you are sick with COVID-19 or think you might have COVID-19, follow the steps below to care for yourself and to help protect other people in your home and community. Stay home except to get medical care Stay home. Most people with COVID-19 have mild illness and can recover at home without medical care. Do not leave your home, except to get medical care. Do not visit public areas and do not go to places where you are unable to wear a mask. Take care of yourself. Get rest and stay hydrated. Take over-the-counter  medicines, such as acetaminophen, to help you feel better. Stay in touch with your doctor. Call before you get medical care. Be sure to get care if you have trouble breathing, or have any other emergency warning signs, or if you think it is an emergency. Avoid public transportation, ride-sharing, or taxis if possible. Get tested If you have symptoms of COVID-19, get tested. While waiting for test results, stay away from others, including staying apart from those living in your household. Get tested as soon as possible after your symptoms start. Treatments may be available for people with COVID-19 who are at risk for becoming very sick. Don't  delay: Treatment must be started early to be effective--some treatments must begin within 5 days of your first symptoms. Contact your healthcare provider right away if your test result is positive to determine if you are eligible. Self-tests are one of several options for testing for the virus that causes COVID-19 and may be more convenient than laboratory-based tests and point-of-care tests. Ask your healthcare provider or your local health department if you need help interpreting your test results. You can visit your state, tribal, local, and territorial health department's website to look for the latest local information on testing sites. Separate yourself from other people As much as possible, stay in a specific room and away from other people and pets in your home. If possible, you should use a separate bathroom. If you need to be around other people or animals in or outside of the home, wear a well-fitting mask. Tell your close contacts that they may have been exposed to COVID-19. An infected person can spread COVID-19 starting 48 hours (or 2 days) before the person has any symptoms or tests positive. By letting your close contacts know they may have been exposed to COVID-19, you are helping to protect everyone. See COVID-19 and Animals if you have questions  about pets. If you are diagnosed with COVID-19, someone from the health department may call you. Answer the call to slow the spread. Monitor your symptoms Symptoms of COVID-19 include fever, cough, or other symptoms. Follow care instructions from your healthcare provider and local health department. Your local health authorities may give instructions on checking your symptoms and reporting information. When to seek emergency medical attention Look for emergency warning signs* for COVID-19. If someone is showing any of these signs, seek emergency medical care immediately: Trouble breathing Persistent pain or pressure in the chest New confusion Inability to wake or stay awake Pale, gray, or blue-colored skin, lips, or nail beds, depending on skin tone *This list is not all possible symptoms. Please call your medical provider for any other symptoms that are severe or concerning to you. Call 911 or call ahead to your local emergency facility: Notify the operator that you are seeking care for someone who has or may have COVID-19. Call ahead before visiting your doctor Call ahead. Many medical visits for routine care are being postponed or done by phone or telemedicine. If you have a medical appointment that cannot be postponed, call your doctor's office, and tell them you have or may have COVID-19. This will help the office protect themselves and other patients. If you are sick, wear a well-fitting mask You should wear a mask if you must be around other people or animals, including pets (even at home). Wear a mask with the best fit, protection, and comfort for you. You don't need to wear the mask if you are alone. If you can't put on a mask (because of trouble breathing, for example), cover your coughs and sneezes in some other way. Try to stay at least 6 feet away from other people. This will help protect the people around you. Masks should not be placed on young children under age 73 years, anyone  who has trouble breathing, or anyone who is not able to remove the mask without help. Cover your coughs and sneezes Cover your mouth and nose with a tissue when you cough or sneeze. Throw away used tissues in a lined trash can. Immediately wash your hands with soap and water for at least 20 seconds. If soap and water  are not available, clean your hands with an alcohol-based hand sanitizer that contains at least 60% alcohol. Clean your hands often Wash your hands often with soap and water for at least 20 seconds. This is especially important after blowing your nose, coughing, or sneezing; going to the bathroom; and before eating or preparing food. Use hand sanitizer if soap and water are not available. Use an alcohol-based hand sanitizer with at least 60% alcohol, covering all surfaces of your hands and rubbing them together until they feel dry. Soap and water are the best option, especially if hands are visibly dirty. Avoid touching your eyes, nose, and mouth with unwashed hands. Handwashing Tips Avoid sharing personal household items Do not share dishes, drinking glasses, cups, eating utensils, towels, or bedding with other people in your home. Wash these items thoroughly after using them with soap and water or put in the dishwasher. Clean surfaces in your home regularly Clean and disinfect high-touch surfaces (for example, doorknobs, tables, handles, light switches, and countertops) in your "sick room" and bathroom. In shared spaces, you should clean and disinfect surfaces and items after each use by the person who is ill. If you are sick and cannot clean, a caregiver or other person should only clean and disinfect the area around you (such as your bedroom and bathroom) on an as needed basis. Your caregiver/other person should wait as long as possible (at least several hours) and wear a mask before entering, cleaning, and disinfecting shared spaces that you use. Clean and disinfect areas that may  have blood, stool, or body fluids on them. Use household cleaners and disinfectants. Clean visible dirty surfaces with household cleaners containing soap or detergent. Then, use a household disinfectant. Use a product from [employer] List N: Disinfectants for Coronavirus (COVID-19). Be sure to follow the instructions on the label to ensure safe and effective use of the product. Many products recommend keeping the surface wet with a disinfectant for a certain period of time (look at "contact time" on the product label). You may also need to wear personal protective equipment, such as gloves, depending on the directions on the product label. Immediately after disinfecting, wash your hands with soap and water for 20 seconds. For completed guidance on cleaning and disinfecting your home, visit Complete Disinfection Guidance. Take steps to improve ventilation at home Improve ventilation (air flow) at home to help prevent from spreading COVID-19 to other people in your household. Clear out COVID-19 virus particles in the air by opening windows, using air filters, and turning on fans in your home. Use this interactive tool to learn how to improve air flow in your home. When you can be around others after being sick with COVID-19 Deciding when you can be around others is different for different situations. Find out when you can safely end home isolation. For any additional questions about your care, contact your healthcare provider or state or local health department. 12/24/2020 Content source: [facility] ([facility]), Division of Viral Diseases This information is not intended to replace advice given to you by your health care provider. Make sure you discuss any questions you have with your health care provider. Document Revised: 02/06/2021 Document Reviewed: 02/06/2021 Elsevier Patient Education  2022 [employer].     If you have been instructed to have an  in-person evaluation today at a local Urgent Care facility, please use the link below. It will take you to a list of all of our available [facility]  Urgent Cares, including address, phone number and hours of operation. Please do not delay care.  [facility]  If you or a family member do not have a primary care provider, use the link below to schedule a visit and establish care. When you choose a [facility] primary care physician or advanced practice provider, you gain a long-term partner in health. Find a Primary Care Provider  Learn more about [facility]'s in-office and virtual care options: [facility]

## 2023-07-12 ENCOUNTER — Other Ambulatory Visit: Payer: Self-pay | Admitting: Urology

## 2023-08-02 DIAGNOSIS — Z85828 Personal history of other malignant neoplasm of skin: Secondary | ICD-10-CM | POA: Diagnosis not present

## 2023-08-02 DIAGNOSIS — D225 Melanocytic nevi of trunk: Secondary | ICD-10-CM | POA: Diagnosis not present

## 2023-08-02 DIAGNOSIS — L821 Other seborrheic keratosis: Secondary | ICD-10-CM | POA: Diagnosis not present

## 2023-08-02 DIAGNOSIS — D2262 Melanocytic nevi of left upper limb, including shoulder: Secondary | ICD-10-CM | POA: Diagnosis not present

## 2023-08-02 DIAGNOSIS — B360 Pityriasis versicolor: Secondary | ICD-10-CM | POA: Diagnosis not present

## 2023-08-06 DIAGNOSIS — R972 Elevated prostate specific antigen [PSA]: Secondary | ICD-10-CM | POA: Diagnosis not present

## 2023-08-27 ENCOUNTER — Encounter: Payer: Self-pay | Admitting: Internal Medicine

## 2023-09-14 DIAGNOSIS — N401 Enlarged prostate with lower urinary tract symptoms: Secondary | ICD-10-CM | POA: Diagnosis not present

## 2023-09-14 DIAGNOSIS — R351 Nocturia: Secondary | ICD-10-CM | POA: Diagnosis not present

## 2023-09-14 DIAGNOSIS — R3915 Urgency of urination: Secondary | ICD-10-CM | POA: Diagnosis not present

## 2023-09-14 DIAGNOSIS — R972 Elevated prostate specific antigen [PSA]: Secondary | ICD-10-CM | POA: Diagnosis not present

## 2023-09-27 IMAGING — MR MR PROSTATE WO/W CM
12 series · 48 of 48 positions shown · IV contrast (multihance)
Comparison: None.

CLINICAL DATA: 55-year-old male with elevated PSA equal 4.29.
prostate biopsy collected 11/01/2020. Biopsy revealed Gleason 3+3=6
adenocarcinoma at the RIGHT apex

EXAM:
MR PROSTATE WITHOUT AND WITH CONTRAST
TECHNIQUE: Multiplanar multisequence MRI images were obtained of the pelvis
centered about the prostate. Pre and post contrast images were
obtained.
CONTRAST:  20mL MULTIHANCE GADOBENATE DIMEGLUMINE 529 MG/ML IV SOLN

[Series 3: T2 · coronal · 3.0mm · 0.56mm/px · 1 of 23 slices shown (1 of 3)]
[im 1/23]
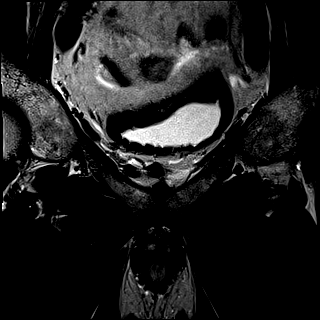

[Series 4: T1 · axial · 5.0mm · 1.25mm/px · 1 of 96 slices shown]
[im 1/96]
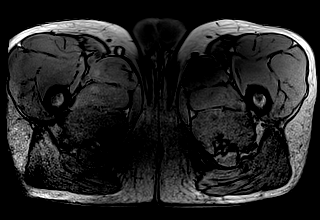

[Series 5: DWI · axial · 3.0mm · 1.75mm/px · 1 of 69 slices shown (1 of 3)]
[im 1/69]
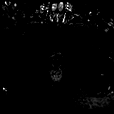

[Series 6: DWI · axial · 3.0mm · 1.75mm/px · 1 of 23 slices shown (2 of 3)]
[im 1/23]
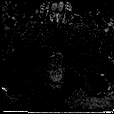

[Series 7: DWI · axial · 3.0mm · 1.75mm/px · 1 of 23 slices shown (3 of 3)]
[im 1/23]
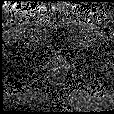

[Series 8: T2 · axial · 3.0mm · 0.56mm/px · 1 of 23 slices shown (2 of 3)]
[im 1/23]
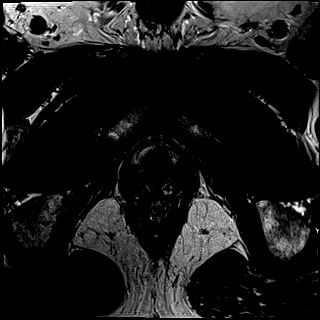

[Series 9: T2 · axial · 1.0mm · 1.04mm/px · z∈[-26,+45]mm · 2 of 72 slices shown (3 of 3)]
[im 1/72]
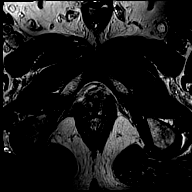
[im 72/72]
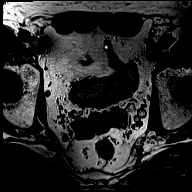

[Series 10: pre t1_twist_tra_dyn · axial · non-contrast · 3.5mm · 0.83mm/px · 1 of 24 slices shown]
[im 1/24]
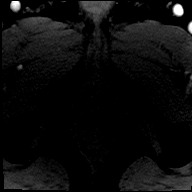

[Series 11: post t1_twist_tra_dyn-copy center · axial · non-contrast · 3.5mm · 0.83mm/px · z∈[-28,+53]mm · 18 of 720 slices shown]
[im 1/720]
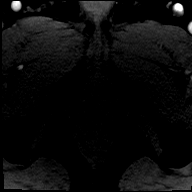
[im 43/720]
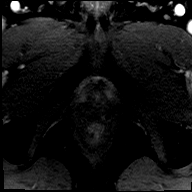
[im 85/720]
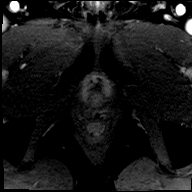
[im 127/720]
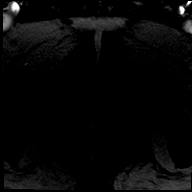
[im 170/720]
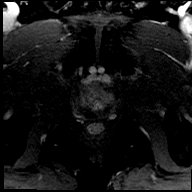
[im 212/720]
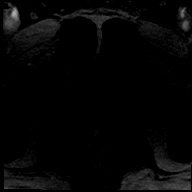
[im 254/720]
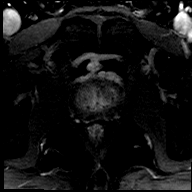
[im 297/720]
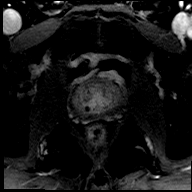
[im 339/720]
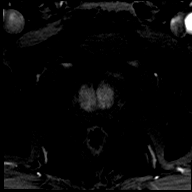
[im 381/720]
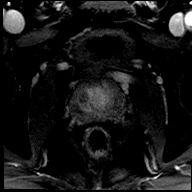
[im 423/720]
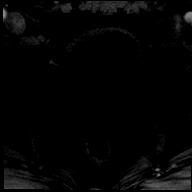
[im 466/720]
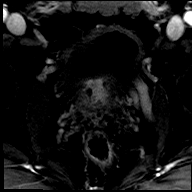
[im 508/720]
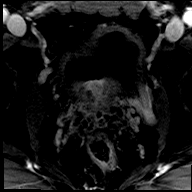
[im 550/720]
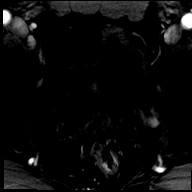
[im 593/720]
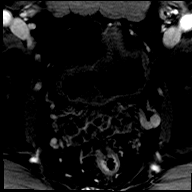
[im 635/720]
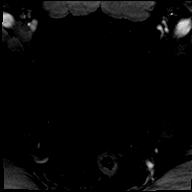
[im 677/720]
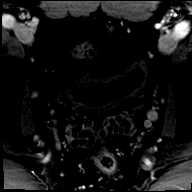
[im 720/720]
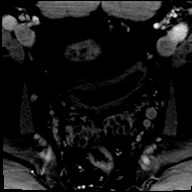

[Series 12: post t1_twist_tra_dyn-copy cent_sub · axial · 3.5mm · 0.83mm/px · z∈[-28,+53]mm · 17 of 696 slices shown]
[im 1/696]
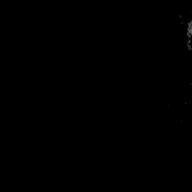
[im 44/696]
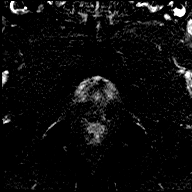
[im 87/696]
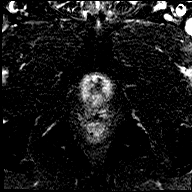
[im 131/696]
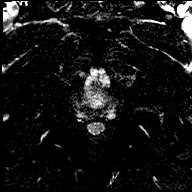
[im 174/696]
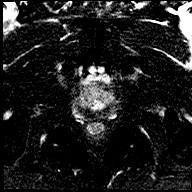
[im 218/696]
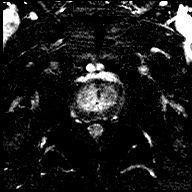
[im 261/696]
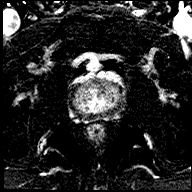
[im 305/696]
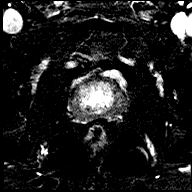
[im 348/696]
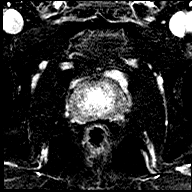
[im 391/696]
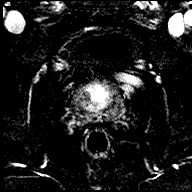
[im 435/696]
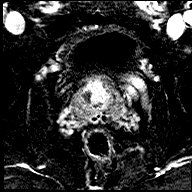
[im 478/696]
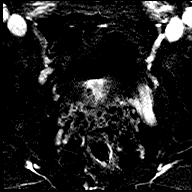
[im 522/696]
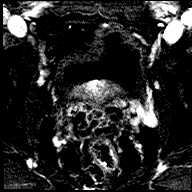
[im 565/696]
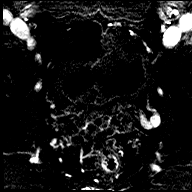
[im 609/696]
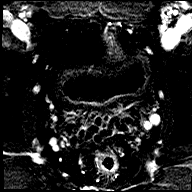
[im 652/696]
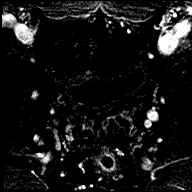
[im 696/696]
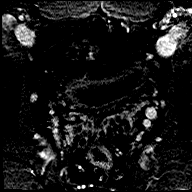

[Series 13: t1_vibe_dixon_tra_f · axial · 2.5mm · 0.91mm/px · z∈[-58,+140]mm · 2 of 80 slices shown]
[im 1/80]
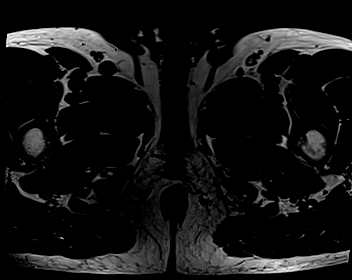
[im 80/80]
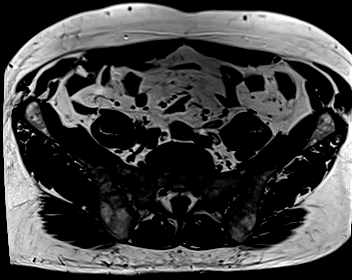

[Series 14: t1_vibe_dixon_tra_w · axial · 2.5mm · 0.91mm/px · z∈[-58,+140]mm · 2 of 80 slices shown]
[im 1/80]
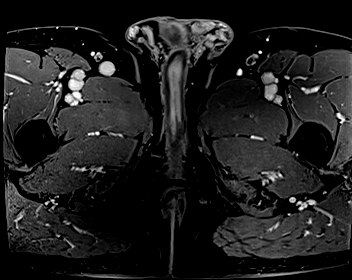
[im 80/80]
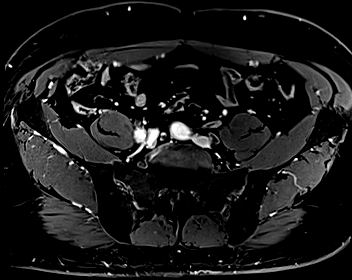

[48 of 48 positions shown; findings below may reference images not displayed]

FINDINGS: Prostate: No foci of restricted diffusion in the peripheral zone. No
focal lesion identified on T2 weighted imaging. Normal high signal
intensity within the peripheral zone on T2 weighted imaging

No suspicious imaging pattern on postcontrast T1 imaging

Transitional zone is enlarged by capsulated nodules with out
suspicious imaging characteristics on T2 weighted imaging (series
8).

Volume: 5.1 x  5.4 x 3.8 cm (volume = 55 cm^3)

Transcapsular spread:  Absent

Seminal vesicle involvement: Absent

Neurovascular bundle involvement: Absent

Pelvic adenopathy: Absent

Bone metastasis: Absent

Other findings: None
IMPRESSION: 1. No high-grade carcinoma identified within peripheral zone.
2. Enlarged nodular transitional zone most consistent with benign
prostate hypertrophy. PI-RADS: 2

## 2024-02-04 DIAGNOSIS — B354 Tinea corporis: Secondary | ICD-10-CM | POA: Diagnosis not present

## 2024-02-04 DIAGNOSIS — R059 Cough, unspecified: Secondary | ICD-10-CM | POA: Diagnosis not present

## 2024-02-04 DIAGNOSIS — R051 Acute cough: Secondary | ICD-10-CM | POA: Diagnosis not present

## 2024-02-04 DIAGNOSIS — J209 Acute bronchitis, unspecified: Secondary | ICD-10-CM | POA: Diagnosis not present

## 2024-02-21 DIAGNOSIS — R21 Rash and other nonspecific skin eruption: Secondary | ICD-10-CM | POA: Diagnosis not present

## 2024-02-21 DIAGNOSIS — B354 Tinea corporis: Secondary | ICD-10-CM | POA: Diagnosis not present

## 2024-04-05 DIAGNOSIS — E6609 Other obesity due to excess calories: Secondary | ICD-10-CM | POA: Diagnosis not present

## 2024-04-05 DIAGNOSIS — R351 Nocturia: Secondary | ICD-10-CM | POA: Diagnosis not present

## 2024-04-05 DIAGNOSIS — N401 Enlarged prostate with lower urinary tract symptoms: Secondary | ICD-10-CM | POA: Diagnosis not present

## 2024-04-05 DIAGNOSIS — C61 Malignant neoplasm of prostate: Secondary | ICD-10-CM | POA: Diagnosis not present

## 2024-04-05 DIAGNOSIS — Z13220 Encounter for screening for lipoid disorders: Secondary | ICD-10-CM | POA: Diagnosis not present

## 2024-04-05 DIAGNOSIS — R739 Hyperglycemia, unspecified: Secondary | ICD-10-CM | POA: Diagnosis not present

## 2024-04-05 DIAGNOSIS — E559 Vitamin D deficiency, unspecified: Secondary | ICD-10-CM | POA: Diagnosis not present

## 2024-04-05 DIAGNOSIS — Z1211 Encounter for screening for malignant neoplasm of colon: Secondary | ICD-10-CM | POA: Diagnosis not present

## 2024-04-05 DIAGNOSIS — Z6830 Body mass index (BMI) 30.0-30.9, adult: Secondary | ICD-10-CM | POA: Diagnosis not present

## 2024-04-05 DIAGNOSIS — Z1330 Encounter for screening examination for mental health and behavioral disorders, unspecified: Secondary | ICD-10-CM | POA: Diagnosis not present

## 2024-04-05 DIAGNOSIS — E66811 Obesity, class 1: Secondary | ICD-10-CM | POA: Diagnosis not present

## 2024-04-05 DIAGNOSIS — Z0000 Encounter for general adult medical examination without abnormal findings: Secondary | ICD-10-CM | POA: Diagnosis not present

## 2024-04-14 DIAGNOSIS — C61 Malignant neoplasm of prostate: Secondary | ICD-10-CM | POA: Diagnosis not present

## 2024-04-14 DIAGNOSIS — N400 Enlarged prostate without lower urinary tract symptoms: Secondary | ICD-10-CM | POA: Diagnosis not present

## 2024-05-18 DIAGNOSIS — C61 Malignant neoplasm of prostate: Secondary | ICD-10-CM | POA: Diagnosis not present

## 2024-05-18 DIAGNOSIS — Z0000 Encounter for general adult medical examination without abnormal findings: Secondary | ICD-10-CM | POA: Diagnosis not present

## 2024-05-18 DIAGNOSIS — Z23 Encounter for immunization: Secondary | ICD-10-CM | POA: Diagnosis not present

## 2024-05-18 DIAGNOSIS — E559 Vitamin D deficiency, unspecified: Secondary | ICD-10-CM | POA: Diagnosis not present

## 2024-05-18 DIAGNOSIS — E663 Overweight: Secondary | ICD-10-CM | POA: Diagnosis not present

## 2024-05-18 DIAGNOSIS — N401 Enlarged prostate with lower urinary tract symptoms: Secondary | ICD-10-CM | POA: Diagnosis not present

## 2024-05-31 DIAGNOSIS — D485 Neoplasm of uncertain behavior of skin: Secondary | ICD-10-CM | POA: Diagnosis not present

## 2024-05-31 DIAGNOSIS — D2239 Melanocytic nevi of other parts of face: Secondary | ICD-10-CM | POA: Diagnosis not present

## 2024-05-31 DIAGNOSIS — L538 Other specified erythematous conditions: Secondary | ICD-10-CM | POA: Diagnosis not present

## 2024-05-31 DIAGNOSIS — C44619 Basal cell carcinoma of skin of left upper limb, including shoulder: Secondary | ICD-10-CM | POA: Diagnosis not present

## 2024-05-31 DIAGNOSIS — B360 Pityriasis versicolor: Secondary | ICD-10-CM | POA: Diagnosis not present

## 2024-05-31 DIAGNOSIS — L570 Actinic keratosis: Secondary | ICD-10-CM | POA: Diagnosis not present

## 2024-05-31 DIAGNOSIS — L578 Other skin changes due to chronic exposure to nonionizing radiation: Secondary | ICD-10-CM | POA: Diagnosis not present

## 2024-05-31 DIAGNOSIS — L820 Inflamed seborrheic keratosis: Secondary | ICD-10-CM | POA: Diagnosis not present

## 2024-05-31 DIAGNOSIS — X32XXXA Exposure to sunlight, initial encounter: Secondary | ICD-10-CM | POA: Diagnosis not present

## 2024-06-13 DIAGNOSIS — C44619 Basal cell carcinoma of skin of left upper limb, including shoulder: Secondary | ICD-10-CM | POA: Diagnosis not present

## 2024-06-13 DIAGNOSIS — I83893 Varicose veins of bilateral lower extremities with other complications: Secondary | ICD-10-CM | POA: Diagnosis not present

## 2024-06-13 DIAGNOSIS — M79604 Pain in right leg: Secondary | ICD-10-CM | POA: Diagnosis not present

## 2024-06-13 DIAGNOSIS — M79605 Pain in left leg: Secondary | ICD-10-CM | POA: Diagnosis not present

## 2024-06-13 DIAGNOSIS — G608 Other hereditary and idiopathic neuropathies: Secondary | ICD-10-CM | POA: Diagnosis not present

## 2024-08-14 DIAGNOSIS — I83891 Varicose veins of right lower extremities with other complications: Secondary | ICD-10-CM | POA: Diagnosis not present

## 2024-08-28 DIAGNOSIS — I83892 Varicose veins of left lower extremities with other complications: Secondary | ICD-10-CM | POA: Diagnosis not present

## 2024-08-28 DIAGNOSIS — I83891 Varicose veins of right lower extremities with other complications: Secondary | ICD-10-CM | POA: Diagnosis not present

## 2024-09-04 DIAGNOSIS — I83891 Varicose veins of right lower extremities with other complications: Secondary | ICD-10-CM | POA: Diagnosis not present

## 2024-09-04 DIAGNOSIS — I83892 Varicose veins of left lower extremities with other complications: Secondary | ICD-10-CM | POA: Diagnosis not present
